# Patient Record
Sex: Female | Born: 1975
Health system: Southern US, Community
[De-identification: ages and names within clinical notes are randomized; demographics above are authoritative.]

## PROBLEM LIST (undated history)

## (undated) DIAGNOSIS — E669 Obesity, unspecified: Secondary | ICD-10-CM

## (undated) DIAGNOSIS — Z22322 Carrier or suspected carrier of Methicillin resistant Staphylococcus aureus: Secondary | ICD-10-CM

## (undated) DIAGNOSIS — N63 Unspecified lump in unspecified breast: Secondary | ICD-10-CM

## (undated) HISTORY — DX: Obesity, unspecified: E66.9

## (undated) HISTORY — PX: NO PAST SURGERIES: SHX2092

## (undated) HISTORY — DX: Unspecified lump in unspecified breast: N63.0

## (undated) HISTORY — DX: Morbid (severe) obesity due to excess calories: E66.01

---

## 1998-05-29 HISTORY — PX: DILATION AND CURETTAGE OF UTERUS: SHX78

## 2013-02-05 ENCOUNTER — Ambulatory Visit: Payer: Self-pay | Admitting: Physician Assistant

## 2015-08-02 ENCOUNTER — Ambulatory Visit
Admission: EM | Admit: 2015-08-02 | Discharge: 2015-08-02 | Disposition: A | Discharge status: Home / Self Care | Payer: BLUE CROSS/BLUE SHIELD | Attending: Family Medicine | Admitting: Family Medicine

## 2015-08-02 DIAGNOSIS — J40 Bronchitis, not specified as acute or chronic: Secondary | ICD-10-CM | POA: Diagnosis not present

## 2015-08-02 DIAGNOSIS — J0100 Acute maxillary sinusitis, unspecified: Secondary | ICD-10-CM

## 2015-08-02 MED ORDER — BENZONATATE 100 MG PO CAPS: 100.0000 mg | ORAL_CAPSULE | Freq: Three times a day (TID) | ORAL | Status: DC | PRN

## 2015-08-02 MED ORDER — GUAIFENESIN-CODEINE 100-10 MG/5ML PO SOLN: 10.0000 mL | Freq: Every evening | ORAL | Status: DC | PRN

## 2015-08-02 MED ORDER — AMOXICILLIN-POT CLAVULANATE 875-125 MG PO TABS: 1.0000 | ORAL_TABLET | Freq: Two times a day (BID) | ORAL | Status: DC

## 2015-08-02 NOTE — ED Provider Notes (Signed)
[facility]  ____________________________________________  Time seen: Approximately 1:10 PM  I have reviewed the triage vital signs and the nursing notes.   HISTORY  Chief Complaint URI  HPI Jenna Price is a 40 y.o. female presents with 7 days of runny nose, nasal congestion, nasal drainage, sinus pressure and cough. Reports thick greenish nasal drainage when blowing nose. Patient reports that she can feel postnasal drainage triggering cough. Denies sore throat. Patient reports cough is worse at night. Patient reports current sinus pressure is described as an aching pressure sensation around the cheeks of her face and forehead at 3 out of 10 aching. Denies pain radiation. Reports several sick contacts around her recently. Denies known fevers.  Denies chest pain or shortness breath, abdominal pain, dysuria, neck or back pain, rash.  LMP: one week ago, denies chance of pregnancy   History reviewed. No pertinent past medical history.  There are no active problems to display for this patient.   History reviewed. No pertinent past surgical history.  Current Outpatient Rx  Name  Route  Sig  Dispense  Refill  .           .           .             Allergies Review of patient's allergies indicates no known allergies.  History reviewed. No pertinent family history.  Social History Social History  Substance Use Topics  . Smoking status: Never Smoker   . Smokeless tobacco: Never Used  . Alcohol Use: No    Review of Systems Constitutional: No fever/chills Eyes: No visual changes. ENT: No sore throat. Positive cough, congestion.  Cardiovascular: Denies chest pain. Respiratory: Denies shortness of breath. Gastrointestinal: No abdominal pain.  No nausea, no vomiting.  No diarrhea.  No constipation. Genitourinary: Negative for dysuria. Musculoskeletal: Negative for back pain. Skin: Negative for rash. Neurological: Negative for headaches, focal weakness or  numbness.  10-point ROS otherwise negative.  ____________________________________________   PHYSICAL EXAM:  VITAL SIGNS: ED Triage Vitals  Enc Vitals Group     BP 08/02/15 1227 132/82 mmHg     Pulse Rate 08/02/15 1227 90     Resp 08/02/15 1227 18     Temp 08/02/15 1227 98.5 F (36.9 C)     Temp Source 08/02/15 1227 Oral     SpO2 08/02/15 1227 100 %     Weight 08/02/15 1227 220 lb (99.791 kg)     Height 08/02/15 1227 5\' 4"  (1.626 m)     Head Cir --      Peak Flow --      Pain Score 08/02/15 1230 0     Pain Loc --      Pain Edu? --      Excl. in GC? --   Constitutional: Alert and oriented. Well appearing and in no acute distress. Eyes: Conjunctivae are normal. PERRL. EOMI. Head: Atraumatic.Mild to moderate tenderness to palpation bilateral maxillary sinuses, mild tenderness to palpation bilateral frontal sinuses. No swelling. No erythema.   Ears: no erythema, normal TMs bilaterally.   Nose: nasal congestion with bilateral nasal turbinate erythema and edema.   Mouth/Throat: Mucous membranes are moist.  Oropharynx non-erythematous.No tonsillar swelling or exudate.  Neck: No stridor.  No cervical spine tenderness to palpation. Hematological/Lymphatic/Immunilogical: No cervical lymphadenopathy. Cardiovascular: Normal rate, regular rhythm. Grossly normal heart sounds.  Good peripheral circulation. Respiratory: Normal respiratory effort.  No retractions. Lungs CTAB. No wheezes, rales or rhonchi. Good air  movement. Dry intermittent cough noted in room.  Gastrointestinal: Soft and nontender. Obese abdomen. Normal Bowel sounds. No CVA tenderness. Musculoskeletal: No lower or upper extremity tenderness nor edema.  Bilateral pedal pulses equal and easily palpated. No cervical, thoracic or lumbar tenderness to palpation. No calf tenderness bilaterally.  Neurologic:  Normal speech and language. No gross focal neurologic deficits are appreciated. No gait instability. Skin:  Skin is warm, dry  and intact. No rash noted. Psychiatric: Mood and affect are normal. Speech and behavior are normal.   ____________________________________________   LABS (all labs ordered are listed, but only abnormal results are displayed)  Labs Reviewed - No data to display   INITIAL IMPRESSION / ASSESSMENT AND PLAN / ED COURSE  Pertinent labs & imaging results that were available during my care of the patient were reviewed by me and considered in my medical decision making (see chart for details).  Well-appearing patient. No acute distress. Presents for complaints of nasal congestion, sinus pressure and sinus drainage. Lungs clear throughout. Abdomen soft and nontender. Moist mucous membranes. Sinus tenderness to palpation with symptoms persisting for 1 week. Will treat sinusitis with oral Augmentin, when necessary Tessalon Perles and when necessary guaifenesin with codeine at night as needed for cough. Encouraged rest, fluids, over-the-counter tylenol or ibuprofen as needed. Follow up with primary care physician as needed.Work note for today and tomorrow given.   Discussed follow up with Primary care physician this week. Discussed follow up and return parameters including no resolution or any worsening concerns. Patient verbalized understanding and agreed to plan.   ____________________________________________   FINAL CLINICAL IMPRESSION(S) / ED DIAGNOSES  Final diagnoses:  Acute maxillary sinusitis, recurrence not specified  Bronchitis      Note: This dictation was prepared with Dragon dictation along with smaller phrase technology. Any transcriptional errors that result from this process are unintentional.     Renford Dills, NP 08/02/15 2057

## 2015-08-02 NOTE — Discharge Instructions (Signed)
Take medication as prescribed. Rest. Drink plenty of fluids.  ° °Follow up with your primary care physician this week as needed. Return to Urgent care for new or worsening concerns.  ° ° °Sinusitis, Adult °Sinusitis is redness, soreness, and puffiness (inflammation) of the air pockets in the bones of your face (sinuses). The redness, soreness, and puffiness can cause air and mucus to get trapped in your sinuses. This can allow germs to grow and cause an infection.  °HOME CARE  °· Drink enough fluids to keep your pee (urine) clear or pale yellow. °· Use a humidifier in your home. °· Run a hot shower to create steam in the bathroom. Sit in the bathroom with the door closed. Breathe in the steam 3-4 times a day. °· Put a warm, moist washcloth on your face 3-4 times a day, or as told by your doctor. °· Use salt water sprays (saline sprays) to wet the thick fluid in your nose. This can help the sinuses drain. °· Only take medicine as told by your doctor. °GET HELP RIGHT AWAY IF:  °· Your pain gets worse. °· You have very bad headaches. °· You are sick to your stomach (nauseous). °· You throw up (vomit). °· You are very sleepy (drowsy) all the time. °· Your face is puffy (swollen). °· Your vision changes. °· You have a stiff neck. °· You have trouble breathing. °MAKE SURE YOU:  °· Understand these instructions. °· Will watch your condition. °· Will get help right away if you are not doing well or get worse. °  °This information is not intended to replace advice given to you by your health care provider. Make sure you discuss any questions you have with your health care provider. °  °Document Released: 11/01/2007 Document Revised: 06/05/2014 Document Reviewed: 12/19/2011 °Elsevier Interactive Patient Education ©2016 [employer] Inc. ° °

## 2015-08-02 NOTE — ED Notes (Signed)
Patient c/o chest congestion and cough which started Tuesday.  Denies fever c/n/v or chest pain.

## 2015-11-29 ENCOUNTER — Ambulatory Visit (INDEPENDENT_AMBULATORY_CARE_PROVIDER_SITE_OTHER): Payer: BLUE CROSS/BLUE SHIELD | Admitting: Family Medicine

## 2015-11-29 ENCOUNTER — Encounter: Payer: Self-pay | Admitting: Family Medicine

## 2015-11-29 VITALS — BP 124/82 | HR 96 | Temp 98.6°F | Resp 16 | Ht 64.0 in | Wt 233.0 lb

## 2015-11-29 DIAGNOSIS — R635 Abnormal weight gain: Secondary | ICD-10-CM | POA: Insufficient documentation

## 2015-11-29 DIAGNOSIS — Z0000 Encounter for general adult medical examination without abnormal findings: Secondary | ICD-10-CM | POA: Diagnosis not present

## 2015-11-29 DIAGNOSIS — F329 Major depressive disorder, single episode, unspecified: Secondary | ICD-10-CM | POA: Diagnosis not present

## 2015-11-29 DIAGNOSIS — R5383 Other fatigue: Secondary | ICD-10-CM | POA: Diagnosis not present

## 2015-11-29 DIAGNOSIS — F32A Depression, unspecified: Secondary | ICD-10-CM

## 2015-11-29 LAB — TSH: TSH: 2.31 m[IU]/L

## 2015-11-29 LAB — CBC WITH DIFFERENTIAL/PLATELET
BASOS PCT: 1 %
Basophils Absolute: 54 cells/uL (ref 0–200)
EOS ABS: 54 {cells}/uL (ref 15–500)
Eosinophils Relative: 1 %
HEMATOCRIT: 40.3 % (ref 35.0–45.0)
Hemoglobin: 13.6 g/dL (ref 11.7–15.5)
LYMPHS ABS: 1890 {cells}/uL (ref 850–3900)
Lymphocytes Relative: 35 %
MCH: 28.9 pg (ref 27.0–33.0)
MCHC: 33.7 g/dL (ref 32.0–36.0)
MCV: 85.6 fL (ref 80.0–100.0)
MONO ABS: 432 {cells}/uL (ref 200–950)
MPV: 9.6 fL (ref 7.5–12.5)
Monocytes Relative: 8 %
NEUTROS ABS: 2970 {cells}/uL (ref 1500–7800)
Neutrophils Relative %: 55 %
PLATELETS: 243 10*3/uL (ref 140–400)
RBC: 4.71 MIL/uL (ref 3.80–5.10)
RDW: 14 % (ref 11.0–15.0)
WBC: 5.4 10*3/uL (ref 3.8–10.8)

## 2015-11-29 LAB — T4, FREE: FREE T4: 1.2 ng/dL (ref 0.8–1.8)

## 2015-11-29 LAB — COMPLETE METABOLIC PANEL WITH GFR
ALBUMIN: 4.1 g/dL (ref 3.6–5.1)
ALK PHOS: 58 U/L (ref 33–115)
ALT: 10 U/L (ref 6–29)
AST: 13 U/L (ref 10–30)
BILIRUBIN TOTAL: 0.6 mg/dL (ref 0.2–1.2)
BUN: 14 mg/dL (ref 7–25)
CALCIUM: 8.8 mg/dL (ref 8.6–10.2)
CO2: 22 mmol/L (ref 20–31)
CREATININE: 0.74 mg/dL (ref 0.50–1.10)
Chloride: 108 mmol/L (ref 98–110)
Glucose, Bld: 90 mg/dL (ref 65–99)
Potassium: 3.9 mmol/L (ref 3.5–5.3)
Sodium: 140 mmol/L (ref 135–146)
TOTAL PROTEIN: 6.3 g/dL (ref 6.1–8.1)

## 2015-11-29 LAB — LIPID PANEL
Cholesterol: 210 mg/dL — ABNORMAL HIGH (ref 125–200)
HDL: 50 mg/dL (ref >=46)
LDL CALC: 116 mg/dL (ref <130)
Total CHOL/HDL Ratio: 4.2 Ratio (ref <=5.0)
Triglycerides: 220 mg/dL — ABNORMAL HIGH (ref <150)
VLDL: 44 mg/dL — ABNORMAL HIGH (ref <30)

## 2015-11-29 LAB — VITAMIN B12: VITAMIN B 12: 351 pg/mL (ref 200–1100)

## 2015-11-29 MED ORDER — ESCITALOPRAM OXALATE 10 MG PO TABS: 10.0000 mg | ORAL_TABLET | Freq: Every day | ORAL | Status: DC

## 2015-11-29 NOTE — Patient Instructions (Addendum)
Please do start the new medicine Consider working with a therapist We'll get labs today If you have not heard anything from my staff in a week about any orders/referrals/studies from today, please contact us here to follow-up [phone]

## 2015-11-29 NOTE — Progress Notes (Signed)
BP 124/82   Pulse 96   Temp 98.6 F (37 C) (Oral)   Resp 16   Ht 5\' 4"  (1.626 m)   Wt 233 lb (105.7 kg)   LMP 11/22/2015   SpO2 95%   BMI 39.99 kg/m    Subjective:    Patient ID: Jenna Price, female    DOB: [birth year], 40 y.o.   MRN: [id number]  HPI: Jenna Price is a [age] y.o. female  Chief Complaint  Patient presents with  . Fatigue  . Anxiety   She is new to me, usual provider is out of the office  She started a new job in late 2015; gained 70 pounds She is tired Short-fused Stressed Night-time awakenings None of this is like her No constipation; no hair changes; no changes in skin No fam hx of thyroid disease; sister has been diagnosed with depression; nothing to do with father, mother died when pt was 40 years old Headaches have increased She is really tired, very fatigued She hopes her husband falls asleep Gritting teeth; tightness in shoulders an neck She has never been on medicine for mood or stress She is the oldest of four Tossing and turning at night Calls into work sick sometimes Sometimes gets panic attacks; might feel it coming on at work and tries to calm down  [facility] 11/29/2015 11/29/2015  Decreased Interest - 0  Down, Depressed, Hopeless 2 0  PHQ - 2 Score 2 0  Altered sleeping 3 -  Tired, decreased energy 3 -  Change in appetite 3 -  Feeling bad or failure about yourself  3 -  Trouble concentrating 2 -  Moving slowly or fidgety/restless 0 -  Suicidal thoughts 0 -  PHQ-9 Score 16 -  Difficult doing work/chores Extremely dIfficult -   Relevant past medical, surgical, family and social history reviewed Past Medical History:  Diagnosis Date  . Obesity    Past Surgical History:  Procedure Laterality Date  . NO PAST SURGERIES     History reviewed. No pertinent family history. Social History  Substance Use Topics  . Smoking status: Never Smoker  . Smokeless tobacco: Never Used  . Alcohol use No   Interim medical  history since last visit reviewed. Allergies and medications reviewed  Review of Systems Per HPI unless specifically indicated above     Objective:    BP 124/82   Pulse 96   Temp 98.6 F (37 C) (Oral)   Resp 16   Ht 5\' 4"  (1.626 m)   Wt 233 lb (105.7 kg)   LMP 11/22/2015   SpO2 95%   BMI 39.99 kg/m   Wt Readings from Last 3 Encounters:  11/29/15 233 lb (105.7 kg)  08/02/15 220 lb (99.8 kg)    Physical Exam  Constitutional: She appears well-developed and well-nourished. No distress.  Obese, weight gain 13 pounds over last 4 months  HENT:  Head: Normocephalic and atraumatic.  Eyes: EOM are normal. No scleral icterus.  Neck: No thyromegaly present.  Cardiovascular: Normal rate, regular rhythm and normal heart sounds.   No murmur heard. Pulmonary/Chest: Effort normal and breath sounds normal. No respiratory distress. She has no wheezes.  Abdominal: Soft. Bowel sounds are normal. She exhibits no distension.  Musculoskeletal: Normal range of motion. She exhibits no edema.  Neurological: She is alert.  Skin: Skin is warm and dry. She is not diaphoretic. No pallor.  Psychiatric: She has a normal mood and affect. Her behavior is  normal. Judgment and thought content normal.       Assessment & Plan:   Problem List Items Addressed This Visit      Other   Depression    Discussed score on PHQ-9; encouraged her to start to work with counselor; offered SSRI; start treatment, but call before next appt if any mania or hypomania or dark thoughts; she agrees      Relevant Medications   escitalopram (LEXAPRO) 10 MG tablet   Abnormal weight gain - Primary    Other Visit Diagnoses    Other fatigue       check labs to r/o hypothyroidism, vit D or B12 deficiency with her associated weight gain and mood issues   Relevant Orders   VITAMIN D 25 Hydroxy (Vit-D Deficiency, Fractures) (Completed)   Vitamin B12 (Completed)   T4, free (Completed)   Preventative health care       check  yearly labs; return for physical soon   Relevant Orders   CBC with Differential/Platelet (Completed)   COMPLETE METABOLIC PANEL WITH GFR (Completed)   TSH (Completed)   Lipid panel (Completed)      Follow up plan: Return in about 3 weeks (around 12/20/2015) for medicine follow-up.  An after-visit summary was printed and given to the patient at check-out.  Please see the patient instructions which may contain other information and recommendations beyond what is mentioned above in the assessment and plan.  Meds ordered this encounter  Medications  . escitalopram (LEXAPRO) 10 MG tablet    Sig: Take 1 tablet (10 mg total) by mouth daily.    Dispense:  30 tablet    Refill:  0    Orders Placed This Encounter  Procedures  . CBC with Differential/Platelet  . COMPLETE METABOLIC PANEL WITH GFR  . TSH  . VITAMIN D 25 Hydroxy (Vit-D Deficiency, Fractures)  . Vitamin B12  . Lipid panel  . T4, free

## 2015-11-30 LAB — VITAMIN D 25 HYDROXY (VIT D DEFICIENCY, FRACTURES): VIT D 25 HYDROXY: 23 ng/mL — AB (ref 30–100)

## 2015-12-10 DIAGNOSIS — H524 Presbyopia: Secondary | ICD-10-CM | POA: Diagnosis not present

## 2015-12-19 DIAGNOSIS — F32A Depression, unspecified: Secondary | ICD-10-CM | POA: Insufficient documentation

## 2015-12-19 DIAGNOSIS — F329 Major depressive disorder, single episode, unspecified: Secondary | ICD-10-CM | POA: Insufficient documentation

## 2015-12-19 NOTE — Assessment & Plan Note (Signed)
Discussed score on PHQ-9; encouraged her to start to work with counselor; offered SSRI; start treatment, but call before next appt if any mania or hypomania or dark thoughts; she agrees

## 2015-12-21 ENCOUNTER — Ambulatory Visit: Payer: BLUE CROSS/BLUE SHIELD | Admitting: Family Medicine

## 2016-01-23 ENCOUNTER — Ambulatory Visit
Admission: EM | Admit: 2016-01-23 | Discharge: 2016-01-23 | Disposition: A | Discharge status: Home / Self Care | Payer: BLUE CROSS/BLUE SHIELD | Attending: Family Medicine | Admitting: Family Medicine

## 2016-01-23 ENCOUNTER — Encounter: Payer: Self-pay | Admitting: Gynecology

## 2016-01-23 DIAGNOSIS — S90821A Blister (nonthermal), right foot, initial encounter: Secondary | ICD-10-CM | POA: Diagnosis not present

## 2016-01-23 DIAGNOSIS — L089 Local infection of the skin and subcutaneous tissue, unspecified: Secondary | ICD-10-CM

## 2016-01-23 HISTORY — DX: Carrier or suspected carrier of methicillin resistant Staphylococcus aureus: Z22.322

## 2016-01-23 MED ORDER — SULFAMETHOXAZOLE-TRIMETHOPRIM 800-160 MG PO TABS: 1.0000 | ORAL_TABLET | Freq: Two times a day (BID) | ORAL | 0 refills | Status: AC

## 2016-01-23 NOTE — ED Triage Notes (Addendum)
Per patient bought new shoe and then had blistered on right ankle. Patient state s/p MRSA

## 2016-01-23 NOTE — Discharge Instructions (Signed)
May continue to apply Bacitracin ointment to area and cover with bandaid. Start Bactrim twice a day as directed. Follow-up with your primary care provider if increased redness, pain, or swelling occurs.

## 2016-01-23 NOTE — ED Provider Notes (Signed)
CSN: [id number]     Arrival date & time 01/23/16  0803 History   First MD Initiated Contact with Patient 01/23/16 0840     Chief Complaint  Patient presents with  . Blister   (Consider location/radiation/quality/duration/timing/severity/associated sxs/prior Treatment) 40 year old female presents with open sore on right outer area of ankle that started 4 days ago. She wore new shoes that irritated her foot. The area has became more sore and swollen in the past 2 days. Concerned over infection since had MRSA about 4 years ago on her thigh. She has applied Bacitracin ointment and covered with a band-aid which has helped some but redness and swelling is spreading today.    The history is provided by the patient.    Past Medical History:  Diagnosis Date  . MRSA (methicillin resistant staph aureus) culture positive   . Obesity    Past Surgical History:  Procedure Laterality Date  . NO PAST SURGERIES     No family history on file. Social History  Substance Use Topics  . Smoking status: Never Smoker  . Smokeless tobacco: Never Used  . Alcohol use No   OB History    No data available     Review of Systems  Constitutional: Negative for chills, fatigue and fever.  Musculoskeletal: Positive for joint swelling.  Skin: Positive for color change and wound.  Neurological: Negative for weakness and numbness.  Hematological: Negative.     Allergies  Review of patient's allergies indicates no known allergies.  Home Medications   Prior to Admission medications   Medication Sig Start Date End Date Taking? Authorizing Provider  escitalopram (LEXAPRO) 10 MG tablet Take 1 tablet (10 mg total) by mouth daily. 11/29/15   Kerman PasseyMelinda P Lada, MD  sulfamethoxazole-trimethoprim (BACTRIM DS,SEPTRA DS) 800-160 MG tablet Take 1 tablet by mouth 2 (two) times daily. 01/23/16 02/02/16  Sudie GrumblingAnn Berry Eron Goble, NP   Meds Ordered and Administered this Visit  Medications - No data to display  BP 133/75 (BP Location:  Left Arm)   Pulse 88   Temp 98.2 F (36.8 C) (Oral)   Resp 16   Ht 5\' 4"  (1.626 m)   Wt 228 lb (103.4 kg)   LMP 01/04/2016 (Exact Date)   SpO2 99%   BMI 39.14 kg/m  No data found.   Physical Exam  Constitutional: She is oriented to person, place, and time. She appears well-developed and well-nourished. No distress.  Cardiovascular: Normal rate and regular rhythm.   Musculoskeletal: Normal range of motion. She exhibits tenderness.       Right foot: There is tenderness and swelling. There is normal range of motion and normal capillary refill.       Feet:  Red, open blister about 2cm present on right lateral aspect of ankle just below malleolus. Clear to yellowish discharge present. Very tender. Slight swelling around area with minimal surrounding erythema. No nuero deficits. Good pulses and capillary refill.   Neurological: She is alert and oriented to person, place, and time. She has normal strength. No sensory deficit.  Skin: Skin is warm and dry. Capillary refill takes less than 2 seconds.  Psychiatric: She has a normal mood and affect. Her behavior is normal. Judgment and thought content normal.    Urgent Care Course   Clinical Course    Procedures (including critical care time)  Labs Review Labs Reviewed - No data to display  Imaging Review No results found.   Visual Acuity Review  Right Eye Distance:  Left Eye Distance:   Bilateral Distance:    Right Eye Near:   Left Eye Near:    Bilateral Near:         MDM   1. Infected blister of right foot, initial encounter    Due to previous history of MRSA, recommend Bactrim DS twice a day for 10 days. Continue to apply Bacitracin ointment to area and cover with bandage. Avoid pressure on area- wear comfortable shoes. Keep area open at bedtime. Follow-up with her PCP if wound is not improving or is getting worse within the next 3 days.     Sudie Grumbling, NP 01/24/16 [phone]

## 2016-04-04 ENCOUNTER — Other Ambulatory Visit: Payer: Self-pay | Admitting: Family Medicine

## 2016-04-04 DIAGNOSIS — Z1231 Encounter for screening mammogram for malignant neoplasm of breast: Secondary | ICD-10-CM

## 2016-04-11 ENCOUNTER — Ambulatory Visit: Admission: RE | Admit: 2016-04-11 | Payer: BLUE CROSS/BLUE SHIELD | Source: Ambulatory Visit

## 2016-04-13 ENCOUNTER — Other Ambulatory Visit: Payer: Self-pay | Admitting: Family Medicine

## 2016-04-13 ENCOUNTER — Ambulatory Visit
Admission: RE | Admit: 2016-04-13 | Discharge: 2016-04-13 | Disposition: A | Discharge status: Home / Self Care | Payer: BLUE CROSS/BLUE SHIELD | Source: Ambulatory Visit | Attending: Family Medicine | Admitting: Family Medicine

## 2016-04-13 DIAGNOSIS — Z1231 Encounter for screening mammogram for malignant neoplasm of breast: Secondary | ICD-10-CM | POA: Diagnosis not present

## 2016-04-13 DIAGNOSIS — R928 Other abnormal and inconclusive findings on diagnostic imaging of breast: Secondary | ICD-10-CM | POA: Diagnosis not present

## 2016-04-14 ENCOUNTER — Other Ambulatory Visit: Payer: Self-pay | Admitting: Family Medicine

## 2016-04-14 ENCOUNTER — Telehealth: Payer: Self-pay | Admitting: Family Medicine

## 2016-04-14 DIAGNOSIS — R928 Other abnormal and inconclusive findings on diagnostic imaging of breast: Secondary | ICD-10-CM | POA: Diagnosis not present

## 2016-04-14 DIAGNOSIS — N6320 Unspecified lump in the left breast, unspecified quadrant: Secondary | ICD-10-CM

## 2016-04-14 NOTE — Telephone Encounter (Signed)
Called pt regarding abnormal  mammogram of the left breast. Dr. lada put in another order of the left breast. Mention to pt that is was abnormal  and she decided to order more test.

## 2016-04-14 NOTE — Telephone Encounter (Signed)
I see patient had an abnormal screening mammogram; please let her know that I put in orders for additional pictures, and we hope everything turns out just fine

## 2016-04-14 NOTE — Assessment & Plan Note (Signed)
Nov 2017; additional imaging ordered

## 2016-04-25 ENCOUNTER — Ambulatory Visit
Admission: RE | Admit: 2016-04-25 | Discharge: 2016-04-25 | Disposition: A | Discharge status: Home / Self Care | Payer: BLUE CROSS/BLUE SHIELD | Source: Ambulatory Visit | Attending: Family Medicine | Admitting: Family Medicine

## 2016-04-25 DIAGNOSIS — N6320 Unspecified lump in the left breast, unspecified quadrant: Secondary | ICD-10-CM | POA: Diagnosis not present

## 2016-07-18 ENCOUNTER — Encounter: Payer: BLUE CROSS/BLUE SHIELD | Admitting: Family Medicine

## 2016-08-03 ENCOUNTER — Encounter: Payer: Self-pay | Admitting: Family Medicine

## 2016-08-03 ENCOUNTER — Ambulatory Visit (INDEPENDENT_AMBULATORY_CARE_PROVIDER_SITE_OTHER): Payer: BLUE CROSS/BLUE SHIELD | Admitting: Family Medicine

## 2016-08-03 HISTORY — DX: Morbid (severe) obesity due to excess calories: E66.01

## 2016-08-03 MED ORDER — NALTREXONE-BUPROPION HCL ER 8-90 MG PO TB12: ORAL_TABLET | ORAL | 0 refills | Status: DC

## 2016-08-03 NOTE — Patient Instructions (Addendum)
For toning, you'll want to do lower weights and more reps than your son  Check out the information at familydoctor.org entitled "Nutrition for Weight Loss: What You Need to Know about Fad Diets" Try to lose between 1-2 pounds per week by taking in fewer calories and burning off more calories You can succeed by limiting portions, limiting foods dense in calories and fat, becoming more active, and drinking 8 glasses of water a day (64 ounces) Don't skip meals, especially breakfast, as skipping meals may alter your metabolism Do not use over-the-counter weight loss pills or gimmicks that claim rapid weight loss A healthy BMI (or body mass index) is between 18.5 and 24.9 You can calculate your ideal BMI at the NIH website [employer].huhttp://www.nhlbi.nih.gov/health/educational/lose_wt/BMI/bmicalc.htm  Bernie CoveySaxenda is the injectable and is my first choice Contrave would be a close second Belviq would be third choice Check with your insurance We have savings cards for all three  Try to limit saturated fats in your diet (bologna, hot dogs, barbeque, cheeseburgers, hamburgers, steak, bacon, sausage, cheese, etc.) and get more fresh fruits, vegetables, and whole grains   Obesity, Adult Obesity is having too much body fat. If you have a BMI of 30 or more, you are obese. BMI is a number that explains how much body fat you have. Obesity is often caused by taking in (consuming) more calories than your body uses. Obesity can cause serious health problems. Changing your lifestyle can help to treat obesity. Follow these instructions at home: Eating and drinking    Follow advice from your doctor about what to eat and drink. Your doctor may tell you to:  Cut down on (limit) fast foods, sweets, and processed snack foods.  Choose low-fat options. For example, choose low-fat milk instead of whole milk.  Eat 5 or more servings of fruits or vegetables every day.  Eat at home more often. This gives you more control over  what you eat.  Choose healthy foods when you eat out.  Learn what a healthy portion size is. A portion size is the amount of a certain food that is healthy for you to eat at one time. This is different for each person.  Keep low-fat snacks available.  Avoid sugary drinks. These include soda, fruit juice, iced tea that is sweetened with sugar, and flavored milk.  Eat a healthy breakfast.  Drink enough water to keep your pee (urine) clear or pale yellow.  Do not go without eating for long periods of time (do not fast).  Do not go on popular or trendy diets (fad diets). Physical Activity   Exercise often, as told by your doctor. Ask your doctor:  What types of exercise are safe for you.  How often you should exercise.  Warm up and stretch before being active.  Do slow stretching after being active (cool down).  Rest between times of being active. Lifestyle   Limit how much time you spend in front of your TV, computer, or video game system (be less sedentary).  Find ways to reward yourself that do not involve food.  Limit alcohol intake to no more than 1 drink a day for nonpregnant women and 2 drinks a day for men. One drink equals 12 oz of beer, 5 oz of wine, or 1 oz of hard liquor. General instructions   Keep a weight loss journal. This can help you keep track of:  The food that you eat.  The exercise that you do.  Take over-the-counter and prescription medicines  only as told by your doctor.  Take vitamins and supplements only as told by your doctor.  Think about joining a support group. Your doctor may be able to help with this.  Keep all follow-up visits as told by your doctor. This is important. Contact a doctor if:  You cannot meet your weight loss goal after you have changed your diet and lifestyle for 6 weeks. This information is not intended to replace advice given to you by your health care provider. Make sure you discuss any questions you have with your  health care provider. Document Released: 08/07/2011 Document Revised: 10/21/2015 Document Reviewed: 03/03/2015 Elsevier Interactive Patient Education  2017 [employer].

## 2016-08-03 NOTE — Assessment & Plan Note (Signed)
Talked with patient about obesity as a disease; so glad she has started her weight loss efforts in a healthy fashion, exercising, eating better; will have her increase water; limit or avoid cow dairy; discussed medications available for weight loss assistance, including Saxenda, Contrave, and Belviq; savings cards given for all three and she'll read about them and contact her insurance company to see which is covered and which she would prefer; discussed risks of them; she will let me know and I'll prescribe; I will not prescribe plain phentermine; see AVS; for toning, do weights but less weight and more reps

## 2016-08-03 NOTE — Progress Notes (Signed)
BP 124/72   Pulse 90   Temp 98.1 F (36.7 C) (Oral)   Resp 14   Wt 236 lb 9.6 oz (107.3 kg)   LMP 08/01/2016   SpO2 96%   BMI 40.61 kg/m    Subjective:    Patient ID: Jenna Price, female    DOB: 27-Sep-1975, 41 y.o.   MRN: [id number]  HPI: Jenna Price is a 41 y.o. female  Chief Complaint  Patient presents with  . Obesity    Wants to talk about weight loss med   She had been working a job where she was traveling a lot Now has a new job, only has to go to [place] on Mondays, and works from home the other days of the week Has to focus on herself now Her son is playing baseball and trying to get bigger; she joined the gym with him and has been working out with him; she does 45 minutes of walking on the treadmill at 3 mph; then she does the weight, arm day and leg day and back day She has been reading a lot Eating broccoli for lunch; probiotic yogurt in the morning; last night, grilled zucchini and petite sirloin She thinks she is on the right track She already feels  Much better She put on so much weight when traveling so much She does not want to buy bigger clothes She took phentermine once before and lost a lot of weight Her peak weight was 247 pounds about a month ago, not back on the scale She stopped cold [place] drinking anything but water; used to drink diet Coke and tea She was reading about Contrave, and is aware of Belviq She had her mammogram done recently No hx MEN-2, no thyroid medullary carcinoma, no hx of pancreatitis Thyroid numbers okay No hx of heart murmur  Depression screen [facility] 2/9 08/03/2016 11/29/2015 11/29/2015  Decreased Interest 0 - 0  Down, Depressed, Hopeless 0 2 0  PHQ - 2 Score 0 2 0  Altered sleeping - 3 -  Tired, decreased energy - 3 -  Change in appetite - 3 -  Feeling bad or failure about yourself  - 3 -  Trouble concentrating - 2 -  Moving slowly or fidgety/restless - 0 -  Suicidal thoughts - 0 -  PHQ-9 Score - 16 -  Difficult doing  work/chores - Extremely dIfficult -    No flowsheet data found.  Relevant past medical, surgical, family and social history reviewed Past Medical History:  Diagnosis Date  . Morbid obesity ([place]) 08/03/2016  . MRSA (methicillin resistant staph aureus) culture positive   . Obesity    Past Surgical History:  Procedure Laterality Date  . NO PAST SURGERIES     Family History  Problem Relation Age of Onset  . Breast cancer Maternal Aunt     mat great aunt  . Drug abuse Mother   . Heart disease Mother    Social History  Substance Use Topics  . Smoking status: Never Smoker  . Smokeless tobacco: Never Used  . Alcohol use No    Interim medical history since last visit reviewed. Allergies and medications reviewed  Review of Systems Per HPI unless specifically indicated above     Objective:    BP 124/72   Pulse 90   Temp 98.1 F (36.7 C) (Oral)   Resp 14   Wt 236 lb 9.6 oz (107.3 kg)   LMP 08/01/2016   SpO2 96%   BMI  40.61 kg/m   Wt Readings from Last 3 Encounters:  08/03/16 236 lb 9.6 oz (107.3 kg)  01/23/16 228 lb (103.4 kg)  11/29/15 233 lb (105.7 kg)    Physical Exam  Constitutional: She appears well-developed and well-nourished. No distress.  Eyes: No scleral icterus.  Neck: No thyromegaly present.  Cardiovascular: Normal rate and regular rhythm.   No murmur heard. Pulmonary/Chest: Effort normal and breath sounds normal.  Abdominal: She exhibits no distension.  Neurological: She is alert.  Skin: No pallor.  Psychiatric: She has a normal mood and affect. Her behavior is normal. Judgment and thought content normal.    Results for orders placed or performed in visit on 11/29/15  CBC with Differential/Platelet  Result Value Ref Range   WBC 5.4 3.8 - 10.8 K/uL   RBC 4.71 3.80 - 5.10 MIL/uL   Hemoglobin 13.6 11.7 - 15.5 g/dL   HCT 40.3 35.0 - 45.0 %   MCV 85.6 80.0 - 100.0 fL   MCH 28.9 27.0 - 33.0 pg   MCHC 33.7 32.0 - 36.0 g/dL   RDW 14.0 11.0 - 15.0 %     Platelets 243 140 - 400 K/uL   MPV 9.6 7.5 - 12.5 fL   Neutro Abs 2,970 1,500 - 7,800 cells/uL   Lymphs Abs 1,890 850 - 3,900 cells/uL   Monocytes Absolute 432 200 - 950 cells/uL   Eosinophils Absolute 54 15 - 500 cells/uL   Basophils Absolute 54 0 - 200 cells/uL   Neutrophils Relative % 55 %   Lymphocytes Relative 35 %   Monocytes Relative 8 %   Eosinophils Relative 1 %   Basophils Relative 1 %   Smear Review Criteria for review not met   COMPLETE METABOLIC PANEL WITH GFR  Result Value Ref Range   Sodium 140 135 - 146 mmol/L   Potassium 3.9 3.5 - 5.3 mmol/L   Chloride 108 98 - 110 mmol/L   CO2 22 20 - 31 mmol/L   Glucose, Bld 90 65 - 99 mg/dL   BUN 14 7 - 25 mg/dL   Creat 0.74 0.50 - 1.10 mg/dL   Total Bilirubin 0.6 0.2 - 1.2 mg/dL   Alkaline Phosphatase 58 33 - 115 U/L   AST 13 10 - 30 U/L   ALT 10 6 - 29 U/L   Total Protein 6.3 6.1 - 8.1 g/dL   Albumin 4.1 3.6 - 5.1 g/dL   Calcium 8.8 8.6 - 10.2 mg/dL   GFR, Est African American >89 >=60 mL/min   GFR, Est Non African American >89 >=60 mL/min  TSH  Result Value Ref Range   TSH 2.31 mIU/L  VITAMIN D 25 Hydroxy (Vit-D Deficiency, Fractures)  Result Value Ref Range   Vit D, 25-Hydroxy 23 (L) 30 - 100 ng/mL  Vitamin B12  Result Value Ref Range   Vitamin B-12 351 200 - 1,100 pg/mL  Lipid panel  Result Value Ref Range   Cholesterol 210 (H) 125 - 200 mg/dL   Triglycerides 220 (H) <150 mg/dL   HDL 50 >=46 mg/dL   Total CHOL/HDL Ratio 4.2 <=5.0 Ratio   VLDL 44 (H) <30 mg/dL   LDL Cholesterol 116 <130 mg/dL  T4, free  Result Value Ref Range   Free T4 1.2 0.8 - 1.8 ng/dL      Assessment & Plan:   Problem List Items Addressed This Visit      Other   Morbid obesity ([place])    Talked with patient about obesity as  a disease; so glad she has started her weight loss efforts in a healthy fashion, exercising, eating better; will have her increase water; limit or avoid cow dairy; discussed medications available for weight  loss assistance, including Saxenda, Contrave, and Belviq; savings cards given for all three and she'll read about them and contact her insurance company to see which is covered and which she would prefer; discussed risks of them; she will let me know and I'll prescribe; I will not prescribe plain phentermine; see AVS; for toning, do weights but less weight and more reps          Follow up plan: Return in about 3 weeks (around 08/24/2016) for complete physical.  An after-visit summary was printed and given to the patient at [facility].  Please see the patient instructions which may contain other information and recommendations beyond what is mentioned above in the assessment and plan.

## 2016-08-22 ENCOUNTER — Other Ambulatory Visit: Payer: Self-pay | Admitting: Family Medicine

## 2016-08-22 ENCOUNTER — Ambulatory Visit (INDEPENDENT_AMBULATORY_CARE_PROVIDER_SITE_OTHER): Payer: BLUE CROSS/BLUE SHIELD | Admitting: Family Medicine

## 2016-08-22 ENCOUNTER — Encounter: Payer: Self-pay | Admitting: Family Medicine

## 2016-08-22 DIAGNOSIS — Z0000 Encounter for general adult medical examination without abnormal findings: Secondary | ICD-10-CM | POA: Diagnosis not present

## 2016-08-22 DIAGNOSIS — E669 Obesity, unspecified: Secondary | ICD-10-CM | POA: Diagnosis not present

## 2016-08-22 DIAGNOSIS — Z124 Encounter for screening for malignant neoplasm of cervix: Secondary | ICD-10-CM

## 2016-08-22 NOTE — Assessment & Plan Note (Signed)
BMI now under 40, praise given; continue hard work, hydration, healthy eating

## 2016-08-22 NOTE — Addendum Note (Signed)
Addended by: Davene CostainGRAVES, Ginnette Gates C on: 08/22/2016 09:22 AM   Modules accepted: Orders

## 2016-08-22 NOTE — Progress Notes (Signed)
Patient ID: Jenna Price, female   DOB: 1975-08-20, 41 y.o.   MRN: [id number]   Subjective:   Jenna Price is a 41 y.o. female here for a complete physical exam  Interim issues since last visit: none  USPSTF grade A and B recommendations Diet: eating better; getting calcium, unsweetened almond milk, kale; fruits and veggies Exercise: started contrave, had been going 4 days a week Skin cancer: n worrisome moles Depression:  Depression screen [facility] 2/9 08/22/2016 08/03/2016 11/29/2015 11/29/2015  Decreased Interest 0 0 - 0  Down, Depressed, Hopeless 0 0 2 0  PHQ - 2 Score 0 0 2 0  Altered sleeping - - 3 -  Tired, decreased energy - - 3 -  Change in appetite - - 3 -  Feeling bad or failure about yourself  - - 3 -  Trouble concentrating - - 2 -  Moving slowly or fidgety/restless - - 0 -  Suicidal thoughts - - 0 -  PHQ-9 Score - - 16 -  Difficult doing work/chores - - Extremely dIfficult -   Hypertension: controlled Obesity: losing weight; using Contrave, lemon water and toast in am; [employer] app Tobacco use: no Alcohol: no  HIV, hep B, hep C: declined STD testing and prevention (chl/gon/syphilis): declined Lipids:  Lab Results  Component Value Date   CHOL 210 (H) 11/29/2015   HDL 50 11/29/2015   LDLCALC 116 11/29/2015   TRIG 220 (H) 11/29/2015   CHOLHDL 4.2 11/29/2015   Glucose: 90 in July 2017 Colorectal cancer: no fam hx of colon cancer; start at age 72 Breast cancer: UTD Apr 25, 2016 BRCA gene screening: no breast or ovarian cancer Intimate partner violence: no abuse Cervical cancer screening: last pap smear maybe 3+ years, Dr. Rutherford Nail did last; no abnormals Lung cancer: n/a Osteoporosis: n/a Fall prevention/vitamin D: getting enough AAA: n/a Aspirin: n/a  Past Medical History:  Diagnosis Date  . Morbid obesity ([place]) 08/03/2016  . MRSA (methicillin resistant staph aureus) culture positive   . Obesity    Past Surgical History:  Procedure Laterality Date  . NO PAST  SURGERIES     Family History  Problem Relation Age of Onset  . Breast cancer Maternal Aunt     mat great aunt  . Drug abuse Mother   . Heart disease Mother    Social History  Substance Use Topics  . Smoking status: Never Smoker  . Smokeless tobacco: Never Used  . Alcohol use No   Review of Systems  Constitutional: Negative for unexpected weight change (patient working hard).  Eyes: Negative for visual disturbance (in readers).  Cardiovascular: Negative for chest pain.  Gastrointestinal: Negative for blood in stool.  Genitourinary: Negative for hematuria and vaginal pain.  Hematological: Does not bruise/bleed easily.    Objective:   Vitals:   08/22/16 0832  BP: 124/78  Pulse: 80  Resp: 16  Temp: 98.2 F (36.8 C)  TempSrc: Oral  SpO2: 98%  Weight: 232 lb 3.2 oz (105.3 kg)  Height: 5' 4" (1.626 m)   Body mass index is 39.86 kg/m. Wt Readings from Last 3 Encounters:  08/22/16 232 lb 3.2 oz (105.3 kg)  08/03/16 236 lb 9.6 oz (107.3 kg)  01/23/16 228 lb (103.4 kg)   Physical Exam  Constitutional: She appears well-developed and well-nourished.  Weight loss noted  HENT:  Head: Normocephalic and atraumatic.  Eyes: Conjunctivae and EOM are normal. Right eye exhibits no hordeolum. Left eye exhibits no hordeolum. No scleral icterus.  Neck: Carotid bruit is not present. No thyromegaly present.  Cardiovascular: Normal rate, regular rhythm, S1 normal, S2 normal and normal heart sounds.   No extrasystoles are present.  Pulmonary/Chest: Effort normal and breath sounds normal. No respiratory distress. Right breast exhibits no inverted nipple, no mass, no nipple discharge, no skin change and no tenderness. Left breast exhibits no inverted nipple, no mass, no nipple discharge, no skin change and no tenderness. Breasts are symmetrical.  Abdominal: Soft. Normal appearance and bowel sounds are normal. She exhibits no distension, no abdominal bruit, no pulsatile midline mass and no  mass. There is no hepatosplenomegaly. There is no tenderness. No hernia.  Genitourinary: Uterus normal. Pelvic exam was performed with patient prone. There is no rash or lesion on the right labia. There is no rash or lesion on the left labia. Cervix exhibits no motion tenderness. Right adnexum displays no mass, no tenderness and no fullness. Left adnexum displays no mass, no tenderness and no fullness.  Musculoskeletal: Normal range of motion. She exhibits no edema.  Lymphadenopathy:       Head (right side): No submandibular adenopathy present.       Head (left side): No submandibular adenopathy present.    She has no cervical adenopathy.    She has no axillary adenopathy.  Neurological: She is alert. She displays no tremor. No cranial nerve deficit. She exhibits normal muscle tone. Gait normal.  Skin: Skin is warm and dry. No bruising and no ecchymosis noted. No cyanosis. No pallor.  Psychiatric: She has a normal mood and affect. Her speech is normal and behavior is normal. Thought content normal. Her mood appears not anxious. She does not exhibit a depressed mood.    Assessment/Plan:   Problem List Items Addressed This Visit      Other   Preventative health care    USPSTF grade A and B recommendations reviewed with patient; age-appropriate recommendations, preventive care, screening tests, etc discussed and encouraged; healthy living encouraged; see AVS for patient education given to patient      Obesity (BMI 35.0-39.9 without comorbidity)    BMI now under 40, praise given; continue hard work, hydration, healthy eating      Cervical cancer screening    Pap smear collected today      Relevant Orders   Pap IG and HPV (high risk) DNA detection       No orders of the defined types were placed in this encounter.  No orders of the defined types were placed in this encounter.   Follow up plan: Return in about 1 year (around 08/22/2017) for complete physical.  An After Visit  Summary was printed and given to the patient.

## 2016-08-22 NOTE — Assessment & Plan Note (Signed)
USPSTF grade A and B recommendations reviewed with patient; age-appropriate recommendations, preventive care, screening tests, etc discussed and encouraged; healthy living encouraged; see AVS for patient education given to patient  

## 2016-08-22 NOTE — Assessment & Plan Note (Signed)
Pap smear collected today 

## 2016-08-22 NOTE — Patient Instructions (Addendum)
Health Maintenance, Female Adopting a healthy lifestyle and getting preventive care can go a long way to promote health and wellness. Talk with your health care provider about what schedule of regular examinations is right for you. This is a good chance for you to check in with your provider about disease prevention and staying healthy. In between checkups, there are plenty of things you can do on your own. Experts have done a lot of research about which lifestyle changes and preventive measures are most likely to keep you healthy. Ask your health care provider for more information. Weight and diet Eat a healthy diet  Be sure to include plenty of vegetables, fruits, low-fat dairy products, and lean protein.  Do not eat a lot of foods high in solid fats, added sugars, or salt.  Get regular exercise. This is one of the most important things you can do for your health.  Most adults should exercise for at least 150 minutes each week. The exercise should increase your heart rate and make you sweat (moderate-intensity exercise).  Most adults should also do strengthening exercises at least twice a week. This is in addition to the moderate-intensity exercise. Maintain a healthy weight  Body mass index (BMI) is a measurement that can be used to identify possible weight problems. It estimates body fat based on height and weight. Your health care provider can help determine your BMI and help you achieve or maintain a healthy weight.  For females 76 years of age and older:  A BMI below 18.5 is considered underweight.  A BMI of 18.5 to 24.9 is normal.  A BMI of 25 to 29.9 is considered overweight.  A BMI of 30 and above is considered obese. Watch levels of cholesterol and blood lipids  You should start having your blood tested for lipids and cholesterol at 42 years of age, then have this test every 5 years.  You may need to have your cholesterol levels checked more often if:  Your lipid or  cholesterol levels are high.  You are older than 42 years of age.  You are at high risk for heart disease. Cancer screening Lung Cancer  Lung cancer screening is recommended for adults 64-42 years old who are at high risk for lung cancer because of a history of smoking.  A yearly low-dose CT scan of the lungs is recommended for people who:  Currently smoke.  Have quit within the past 15 years.  Have at least a 30-pack-year history of smoking. A pack year is smoking an average of one pack of cigarettes a day for 1 year.  Yearly screening should continue until it has been 15 years since you quit.  Yearly screening should stop if you develop a health problem that would prevent you from having lung cancer treatment. Breast Cancer  Practice breast self-awareness. This means understanding how your breasts normally appear and feel.  It also means doing regular breast self-exams. Let your health care provider know about any changes, no matter how small.  If you are in your 20s or 30s, you should have a clinical breast exam (CBE) by a health care provider every 1-3 years as part of a regular health exam.  If you are 34 or older, have a CBE every year. Also consider having a breast X-ray (mammogram) every year.  If you have a family history of breast cancer, talk to your health care provider about genetic screening.  If you are at high risk for breast cancer, talk  to your health care provider about having an MRI and a mammogram every year.  Breast cancer gene (BRCA) assessment is recommended for women who have family members with BRCA-related cancers. BRCA-related cancers include:  Breast.  Ovarian.  Tubal.  Peritoneal cancers.  Results of the assessment will determine the need for genetic counseling and BRCA1 and BRCA2 testing. Cervical Cancer  Your health care provider may recommend that you be screened regularly for cancer of the pelvic organs (ovaries, uterus, and vagina).  This screening involves a pelvic examination, including checking for microscopic changes to the surface of your cervix (Pap test). You may be encouraged to have this screening done every 3 years, beginning at age 24.  For women ages 66-65, health care providers may recommend pelvic exams and Pap testing every 3 years, or they may recommend the Pap and pelvic exam, combined with testing for human papilloma virus (HPV), every 5 years. Some types of HPV increase your risk of cervical cancer. Testing for HPV may also be done on women of any age with unclear Pap test results.  Other health care providers may not recommend any screening for nonpregnant women who are considered low risk for pelvic cancer and who do not have symptoms. Ask your health care provider if a screening pelvic exam is right for you.  If you have had past treatment for cervical cancer or a condition that could lead to cancer, you need Pap tests and screening for cancer for at least 20 years after your treatment. If Pap tests have been discontinued, your risk factors (such as having a new sexual partner) need to be reassessed to determine if screening should resume. Some women have medical problems that increase the chance of getting cervical cancer. In these cases, your health care provider may recommend more frequent screening and Pap tests. Colorectal Cancer  This type of cancer can be detected and often prevented.  Routine colorectal cancer screening usually begins at 42 years of age and continues through 42 years of age.  Your health care provider may recommend screening at an earlier age if you have risk factors for colon cancer.  Your health care provider may also recommend using home test kits to check for hidden blood in the stool.  A small camera at the end of a tube can be used to examine your colon directly (sigmoidoscopy or colonoscopy). This is done to check for the earliest forms of colorectal cancer.  Routine  screening usually begins at age 41.  Direct examination of the colon should be repeated every 5-10 years through 42 years of age. However, you may need to be screened more often if early forms of precancerous polyps or small growths are found. Skin Cancer  Check your skin from head to toe regularly.  Tell your health care provider about any new moles or changes in moles, especially if there is a change in a mole's shape or color.  Also tell your health care provider if you have a mole that is larger than the size of a pencil eraser.  Always use sunscreen. Apply sunscreen liberally and repeatedly throughout the day.  Protect yourself by wearing long sleeves, pants, a wide-brimmed hat, and sunglasses whenever you are outside. Heart disease, diabetes, and high blood pressure  High blood pressure causes heart disease and increases the risk of stroke. High blood pressure is more likely to develop in:  People who have blood pressure in the high end of the normal range (130-139/85-89 mm Hg).  People who are overweight or obese.  People who are African American.  If you are 59-24 years of age, have your blood pressure checked every 3-5 years. If you are 34 years of age or older, have your blood pressure checked every year. You should have your blood pressure measured twice-once when you are at a hospital or clinic, and once when you are not at a hospital or clinic. Record the average of the two measurements. To check your blood pressure when you are not at a hospital or clinic, you can use:  An automated blood pressure machine at a pharmacy.  A home blood pressure monitor.  If you are between 29 years and 60 years old, ask your health care provider if you should take aspirin to prevent strokes.  Have regular diabetes screenings. This involves taking a blood sample to check your fasting blood sugar level.  If you are at a normal weight and have a low risk for diabetes, have this test once  every three years after 42 years of age.  If you are overweight and have a high risk for diabetes, consider being tested at a younger age or more often. Preventing infection Hepatitis B  If you have a higher risk for hepatitis B, you should be screened for this virus. You are considered at high risk for hepatitis B if:  You were born in a country where hepatitis B is common. Ask your health care provider which countries are considered high risk.  Your parents were born in a high-risk country, and you have not been immunized against hepatitis B (hepatitis B vaccine).  You have HIV or AIDS.  You use needles to inject street drugs.  You live with someone who has hepatitis B.  You have had sex with someone who has hepatitis B.  You get hemodialysis treatment.  You take certain medicines for conditions, including cancer, organ transplantation, and autoimmune conditions. Hepatitis C  Blood testing is recommended for:  Everyone born from [birth year] through 1966.  Anyone with known risk factors for hepatitis C. Sexually transmitted infections (STIs)  You should be screened for sexually transmitted infections (STIs) including gonorrhea and chlamydia if:  You are sexually active and are younger than 42 years of age.  You are older than 42 years of age and your health care provider tells you that you are at risk for this type of infection.  Your sexual activity has changed since you were last screened and you are at an increased risk for chlamydia or gonorrhea. Ask your health care provider if you are at risk.  If you do not have HIV, but are at risk, it may be recommended that you take a prescription medicine daily to prevent HIV infection. This is called pre-exposure prophylaxis (PrEP). You are considered at risk if:  You are sexually active and do not regularly use condoms or know the HIV status of your partner(s).  You take drugs by injection.  You are sexually active with a partner  who has HIV. Talk with your health care provider about whether you are at high risk of being infected with HIV. If you choose to begin PrEP, you should first be tested for HIV. You should then be tested every 3 months for as long as you are taking PrEP. Pregnancy  If you are premenopausal and you may become pregnant, ask your health care provider about preconception counseling.  If you may become pregnant, take 400 to 800 micrograms (mcg) of folic acid  every day.  If you want to prevent pregnancy, talk to your health care provider about birth control (contraception). Osteoporosis and menopause  Osteoporosis is a disease in which the bones lose minerals and strength with aging. This can result in serious bone fractures. Your risk for osteoporosis can be identified using a bone density scan.  If you are 4 years of age or older, or if you are at risk for osteoporosis and fractures, ask your health care provider if you should be screened.  Ask your health care provider whether you should take a calcium or vitamin D supplement to lower your risk for osteoporosis.  Menopause may have certain physical symptoms and risks.  Hormone replacement therapy may reduce some of these symptoms and risks. Talk to your health care provider about whether hormone replacement therapy is right for you. Follow these instructions at home:  Schedule regular health, dental, and eye exams.  Stay current with your immunizations.  Do not use any tobacco products including cigarettes, chewing tobacco, or electronic cigarettes.  If you are pregnant, do not drink alcohol.  If you are breastfeeding, limit how much and how often you drink alcohol.  Limit alcohol intake to no more than 1 drink per day for nonpregnant women. One drink equals 12 ounces of beer, 5 ounces of wine, or 1 ounces of hard liquor.  Do not use street drugs.  Do not share needles.  Ask your health care provider for help if you need support  or information about quitting drugs.  Tell your health care provider if you often feel depressed.  Tell your health care provider if you have ever been abused or do not feel safe at home. This information is not intended to replace advice given to you by your health care provider. Make sure you discuss any questions you have with your health care provider. Document Released: 11/28/2010 Document Revised: 10/21/2015 Document Reviewed: 02/16/2015 Elsevier Interactive Patient Education  2017 [employer].

## 2016-08-28 LAB — PAP IG AND HPV HIGH-RISK: HPV DNA HIGH RISK: NOT DETECTED

## 2018-04-29 ENCOUNTER — Ambulatory Visit
Admission: EM | Admit: 2018-04-29 | Discharge: 2018-04-29 | Disposition: A | Discharge status: Home / Self Care | Payer: BLUE CROSS/BLUE SHIELD | Attending: Family Medicine | Admitting: Family Medicine

## 2018-04-29 ENCOUNTER — Other Ambulatory Visit: Payer: Self-pay

## 2018-04-29 DIAGNOSIS — J069 Acute upper respiratory infection, unspecified: Secondary | ICD-10-CM

## 2018-04-29 DIAGNOSIS — R062 Wheezing: Secondary | ICD-10-CM

## 2018-04-29 DIAGNOSIS — B9789 Other viral agents as the cause of diseases classified elsewhere: Secondary | ICD-10-CM

## 2018-04-29 MED ORDER — HYDROCOD POLST-CPM POLST ER 10-8 MG/5ML PO SUER: 5.0000 mL | Freq: Every evening | ORAL | 0 refills | Status: DC | PRN

## 2018-04-29 MED ORDER — PREDNISONE 20 MG PO TABS: 40.0000 mg | ORAL_TABLET | Freq: Every day | ORAL | 0 refills | Status: DC

## 2018-04-29 MED ORDER — ALBUTEROL SULFATE HFA 108 (90 BASE) MCG/ACT IN AERS: 2.0000 | INHALATION_SPRAY | RESPIRATORY_TRACT | 0 refills | Status: DC | PRN

## 2018-04-29 MED ORDER — DOXYCYCLINE HYCLATE 100 MG PO CAPS: 100.0000 mg | ORAL_CAPSULE | Freq: Two times a day (BID) | ORAL | 0 refills | Status: DC

## 2018-04-29 MED ORDER — BENZONATATE 100 MG PO CAPS: 100.0000 mg | ORAL_CAPSULE | Freq: Three times a day (TID) | ORAL | 0 refills | Status: DC | PRN

## 2018-04-29 NOTE — Discharge Instructions (Signed)
Take medication as prescribed. Rest. Drink plenty of fluids.  ° °Follow up with your primary care physician this week as needed. Return to Urgent care for new or worsening concerns.  ° °

## 2018-04-29 NOTE — ED Triage Notes (Signed)
Patient complains of cough, congestion, unable to sleep due to cough, sore throat and diarrhea. Patient states that symptoms started 2 weeks ago but seemed to worsen on Friday.

## 2018-04-29 NOTE — ED Provider Notes (Signed)
[facility] ____________________________________________  Time seen: Approximately 9:30 AM  I have reviewed the triage vital signs and the nursing notes.   HISTORY  Chief Complaint Cough   HPI Jenna Price is a 42 y.o. female presenting for evaluation of 2 weeks of cough and chest congestion.  States initially her son came home sick, and states that it has since gone through the household.  Reports others in the house have been feeling better however her symptoms continue.  Reports continued cough and chest congestion, cough is sometimes productive, often dry.  Does occasionally hear herself wheeze.  Cough tends to happen and coughing fits.  Cough is disrupting sleep.  Denies known fevers.  Continues to overall eat and drink well.  Has continue to remain active.  Unresolved with multiple over-the-counter cough and congestion medications.  Reports started to have sore throat and some intermittent diarrhea 3 days ago.  States sore throat is mild currently.  Denies sinus pain or sinus congestion.  Denies chest pain or shortness of breath.  Does feel sore from coughing.  Denies other aggravating alleviating factors.  Denies history of asthma or recurrent respiratory illnesses.  Reports otherwise doing well.  Kerman PasseyLada, Melinda P, MD: PCP  Patient's last menstrual period was 04/24/2018.   Past Medical History:  Diagnosis Date  . Morbid obesity (HCC) 08/03/2016  . MRSA (methicillin resistant staph aureus) culture positive   . Obesity     Patient Active Problem List   Diagnosis Date Noted  . Preventative health care 08/22/2016  . Obesity (BMI 35.0-39.9 without comorbidity) 08/22/2016  . Cervical cancer screening 08/22/2016  . Abnormal mammogram of left breast 04/14/2016    Past Surgical History:  Procedure Laterality Date  . NO PAST SURGERIES       No current facility-administered medications for this encounter.   Current Outpatient Medications:  .  albuterol (PROVENTIL  HFA;VENTOLIN HFA) 108 (90 Base) MCG/ACT inhaler, Inhale 2 puffs into the lungs every 4 (four) hours as needed for wheezing., Disp: 1 Inhaler, Rfl: 0 .  benzonatate (TESSALON PERLES) 100 MG capsule, Take 1 capsule (100 mg total) by mouth 3 (three) times daily as needed., Disp: 15 capsule, Rfl: 0 .  chlorpheniramine-HYDROcodone (TUSSIONEX PENNKINETIC ER) 10-8 MG/5ML SUER, Take 5 mLs by mouth at bedtime as needed for cough. do not drive or operate machinery while taking as can cause drowsiness., Disp: 50 mL, Rfl: 0 .  doxycycline (VIBRAMYCIN) 100 MG capsule, Take 1 capsule (100 mg total) by mouth 2 (two) times daily., Disp: 20 capsule, Rfl: 0 .  predniSONE (DELTASONE) 20 MG tablet, Take 2 tablets (40 mg total) by mouth daily., Disp: 10 tablet, Rfl: 0  Allergies Patient has no known allergies.  Family History  Problem Relation Age of Onset  . Breast cancer Maternal Aunt        mat great aunt  . Drug abuse Mother   . Heart disease Mother     Social History Social History   Tobacco Use  . Smoking status: Never Smoker  . Smokeless tobacco: Never Used  Substance Use Topics  . Alcohol use: No    Alcohol/week: 0.0 standard drinks  . Drug use: No    Review of Systems Constitutional: No fever ENT: as above.  Cardiovascular: Denies chest pain. Respiratory: Denies shortness of breath. Gastrointestinal: No abdominal pain.   Musculoskeletal: Negative for back pain. Skin: Negative for rash.  ____________________________________________   PHYSICAL EXAM:  VITAL SIGNS: ED Triage Vitals  Enc Vitals Group  BP 04/29/18 0846 (!) 145/99     Pulse Rate 04/29/18 0846 (!) 118     Resp 04/29/18 0846 18     Temp 04/29/18 0846 98.8 F (37.1 C)     Temp Source 04/29/18 0846 Oral     SpO2 04/29/18 0846 99 %     Weight 04/29/18 0844 200 lb (90.7 kg)     Height 04/29/18 0844 5\' 3"  (1.6 m)     Head Circumference --      Peak Flow --      Pain Score 04/29/18 0844 7     Pain Loc --      Pain  Edu? --      Excl. in GC? --     Constitutional: Alert and oriented. Well appearing and in no acute distress. Eyes: Conjunctivae are normal.  Head: Atraumatic. No sinus tenderness to palpation. No swelling. No erythema.  Ears: no erythema, normal TMs bilaterally.   Nose:Nasal congestion   Mouth/Throat: Mucous membranes are moist. Mild pharyngeal erythema. No tonsillar swelling or exudate.  Neck: No stridor.  No cervical spine tenderness to palpation. Hematological/Lymphatic/Immunilogical: No cervical lymphadenopathy. Cardiovascular: Normal rate, regular rhythm. Grossly normal heart sounds.  Good peripheral circulation. Respiratory: Normal respiratory effort.  No retractions.Good air movement.  Mild scattered expiratory wheezes.  Scattered rhonchi.  No focal area of consolidation.  Dry intermittent cough noted in room with bronchospasm.  Speaks in complete sentences. Musculoskeletal: Ambulatory with steady gait.  No lower extremity edema noted bilaterally. Neurologic:  Normal speech and language. No gait instability. Skin:  Skin appears warm, dry and intact. No rash noted. Psychiatric: Mood and affect are normal. Speech and behavior are normal.  ___________________________________________   LABS (all labs ordered are listed, but only abnormal results are displayed)  Labs Reviewed - No data to display ____________________________________________   PROCEDURES Procedures   INITIAL IMPRESSION / ASSESSMENT AND PLAN / ED COURSE  Pertinent labs & imaging results that were available during my care of the patient were reviewed by me and considered in my medical decision making (see chart for details).  Overall well-appearing patient.  No acute distress.  Suspect recent viral illness, concern for secondary infection.  Discussed evaluation of chest x-ray, will defer at this time.  Will treat with doxycycline, prednisone, albuterol, Tessalon Perles and Tussionex.  Encourage rest, fluids,  supportive care.  Discussed strict follow-up and reevaluation.Discussed indication, risks and benefits of medications with patient.  Discussed follow up with Primary care physician this week. Discussed follow up and return parameters including no resolution or any worsening concerns. Patient verbalized understanding and agreed to plan.   ____________________________________________   FINAL CLINICAL IMPRESSION(S) / ED DIAGNOSES  Final diagnoses:  Viral URI with cough  Wheezing     ED Discharge Orders         Ordered    benzonatate (TESSALON PERLES) 100 MG capsule  3 times daily PRN     04/29/18 0928    chlorpheniramine-HYDROcodone (TUSSIONEX PENNKINETIC ER) 10-8 MG/5ML SUER  At bedtime PRN     04/29/18 0928    doxycycline (VIBRAMYCIN) 100 MG capsule  2 times daily     04/29/18 0928    predniSONE (DELTASONE) 20 MG tablet  Daily     04/29/18 0928    albuterol (PROVENTIL HFA;VENTOLIN HFA) 108 (90 Base) MCG/ACT inhaler  Every 4 hours PRN     04/29/18 1610           Note: This dictation was prepared with Reubin Milan  dictation along with smaller phrase technology. Any transcriptional errors that result from this process are unintentional.         Renford Dills, NP 04/29/18 1002

## 2018-05-12 DIAGNOSIS — R300 Dysuria: Secondary | ICD-10-CM | POA: Diagnosis not present

## 2018-05-12 DIAGNOSIS — R1084 Generalized abdominal pain: Secondary | ICD-10-CM | POA: Diagnosis not present

## 2018-05-12 DIAGNOSIS — N3001 Acute cystitis with hematuria: Secondary | ICD-10-CM | POA: Diagnosis not present

## 2018-05-12 DIAGNOSIS — R3915 Urgency of urination: Secondary | ICD-10-CM | POA: Diagnosis not present

## 2018-05-12 DIAGNOSIS — R000 Tachycardia, unspecified: Secondary | ICD-10-CM | POA: Diagnosis not present

## 2018-05-12 DIAGNOSIS — R350 Frequency of micturition: Secondary | ICD-10-CM | POA: Diagnosis not present

## 2018-11-18 ENCOUNTER — Encounter: Payer: Self-pay | Admitting: Emergency Medicine

## 2018-11-18 ENCOUNTER — Ambulatory Visit
Admission: EM | Admit: 2018-11-18 | Discharge: 2018-11-18 | Disposition: A | Discharge status: Home / Self Care | Payer: BC Managed Care – PPO | Attending: Urgent Care | Admitting: Urgent Care

## 2018-11-18 ENCOUNTER — Other Ambulatory Visit: Payer: Self-pay

## 2018-11-18 DIAGNOSIS — N3000 Acute cystitis without hematuria: Secondary | ICD-10-CM | POA: Diagnosis not present

## 2018-11-18 DIAGNOSIS — N926 Irregular menstruation, unspecified: Secondary | ICD-10-CM | POA: Diagnosis not present

## 2018-11-18 LAB — URINALYSIS, COMPLETE (UACMP) WITH MICROSCOPIC
Bilirubin Urine: NEGATIVE
Glucose, UA: NEGATIVE mg/dL
Ketones, ur: NEGATIVE mg/dL
Nitrite: NEGATIVE
Protein, ur: NEGATIVE mg/dL
Specific Gravity, Urine: >1.03 — ABNORMAL HIGH (ref 1.005–1.030)
WBC, UA: >50 WBC/hpf (ref 0–5)
pH: 5 (ref 5.0–8.0)

## 2018-11-18 LAB — PREGNANCY, URINE: Preg Test, Ur: NEGATIVE

## 2018-11-18 MED ORDER — PHENAZOPYRIDINE HCL 200 MG PO TABS: 200.0000 mg | ORAL_TABLET | Freq: Three times a day (TID) | ORAL | 0 refills | Status: DC | PRN

## 2018-11-18 MED ORDER — SULFAMETHOXAZOLE-TRIMETHOPRIM 800-160 MG PO TABS: 1.0000 | ORAL_TABLET | Freq: Two times a day (BID) | ORAL | 0 refills | Status: AC

## 2018-11-18 NOTE — ED Provider Notes (Addendum)
Name: Jenna Price DOB: [birth year] MRN: [id number] CSN: [id number] PCP: Arnetha Courser, MD  Arrival date and time:  11/18/18 1050  Chief Complaint:  Recurrent UTI  NOTE: Prior to seeing the patient today, I have reviewed the triage nursing documentation and vital signs. Clinical staff has updated patient's PMH/PSHx, current medication list, and drug allergies/intolerances to ensure comprehensive history available to assist in medical decision making.   History:   HPI: Jenna Price is a 43 y.o. female who presents today with complaints of urinary symptoms that began on 11/16/2018. She complains of dysuria, frequency, and voiding small amounts.  She has not appreciated any gross hematuria, nor has she noticed her urine being malodorous. Patient denies any associated nausea, vomiting, fever, and chills. She is eating and drinking well. She has not experienced any pain in her lower back or flank, but notes significant discomfort/pressure in her suprapubic area. No vaginal pain, bleeding, or discharge. Patient also noting a history of irregular menstrual cycles as of late. She is questioning pregnancy.  Patient advising nursing staff that she was treated at [facility] approximately 6 weeks ago for the same and the infection "never really went away". In review of the available records in [place] from [facility] patient was last seen and treated for a urinary tract infection on 05/13/2018. Urine culture grew out [employer], and she was appropriately treated with a course of cephalexin.   She presents to the clinic today tachycardic (114 bpm) and hypertensive (146/103). No associated chest pain, shortness of breath, or palpitations.    Past Medical History:  Diagnosis Date  . Morbid obesity ([place]) 08/03/2016  . MRSA (methicillin resistant staph aureus) culture positive   . Obesity     Past Surgical History:  Procedure Laterality Date  . NO PAST SURGERIES      Family History  Problem Relation  Age of Onset  . Breast cancer Maternal Aunt        mat great aunt  . Drug abuse Mother   . Heart disease Mother     Social History   Socioeconomic History  . Marital status: Married    Spouse name: Not on file  . Number of children: Not on file  . Years of education: Not on file  . Highest education level: Not on file  Occupational History  . Not on file  Social Needs  . Financial resource strain: Not on file  . Food insecurity    Worry: Not on file    Inability: Not on file  . Transportation needs    Medical: Not on file    Non-medical: Not on file  Tobacco Use  . Smoking status: Never Smoker  . Smokeless tobacco: Never Used  Substance and Sexual Activity  . Alcohol use: No    Alcohol/week: 0.0 standard drinks  . Drug use: No  . Sexual activity: Not on file  Lifestyle  . Physical activity    Days per week: Not on file    Minutes per session: Not on file  . Stress: Not on file  Relationships  . Social Herbalist on phone: Not on file    Gets together: Not on file    Attends religious service: Not on file    Active member of club or organization: Not on file    Attends meetings of clubs or organizations: Not on file    Relationship status: Not on file  . Intimate partner violence  Fear of current or ex partner: Not on file    Emotionally abused: Not on file    Physically abused: Not on file    Forced sexual activity: Not on file  Other Topics Concern  . Not on file  Social History Narrative  . Not on file    Patient Active Problem List   Diagnosis Date Noted  . Preventative health care 08/22/2016  . Obesity (BMI 35.0-39.9 without comorbidity) 08/22/2016  . Cervical cancer screening 08/22/2016  . Abnormal mammogram of left breast 04/14/2016    Home Medications:    Current Meds  Medication Sig  . albuterol (PROVENTIL HFA;VENTOLIN HFA) 108 (90 Base) MCG/ACT inhaler Inhale 2 puffs into the lungs every 4 (four) hours as needed for wheezing.   . [DISCONTINUED] predniSONE (DELTASONE) 20 MG tablet Take 2 tablets (40 mg total) by mouth daily.    Allergies:   Patient has no known allergies.  Review of Systems (ROS): Review of Systems  Constitutional: Negative for chills and fever.  Respiratory: Negative for cough and shortness of breath.   Cardiovascular: Negative for chest pain and palpitations.  Gastrointestinal: Positive for abdominal pain (suprapubic). Negative for diarrhea, nausea and vomiting.  Genitourinary: Positive for decreased urine volume, dysuria, frequency and menstrual problem (irregular periods over last 4-6 months). Negative for flank pain, hematuria, pelvic pain, urgency, vaginal bleeding, vaginal discharge and vaginal pain.  Musculoskeletal: Negative for back pain and myalgias.  Skin: Negative.   Neurological: Negative for dizziness, weakness and headaches.     Physical Exam:  Triage Vital Signs ED Triage Vitals  Enc Vitals Group     BP 11/18/18 1114 (!) 146/103     Pulse Rate 11/18/18 1114 (!) 114     Resp 11/18/18 1114 18     Temp 11/18/18 1114 98.4 F (36.9 C)     Temp Source 11/18/18 1114 Oral     SpO2 11/18/18 1114 100 %     Weight 11/18/18 1116 220 lb (99.8 kg)     Height 11/18/18 1116 5\' 3"  (1.6 m)     Head Circumference --      Peak Flow --      Pain Score 11/18/18 1115 5     Pain Loc --      Pain Edu? --      Excl. in GC? --     Physical Exam  Constitutional: She is oriented to person, place, and time and well-developed, well-nourished, and in no distress.  HENT:  Head: Normocephalic and atraumatic.  Mouth/Throat: Oropharynx is clear and moist and mucous membranes are normal.  Cardiovascular: Regular rhythm, normal heart sounds and intact distal pulses. Tachycardia present. Exam reveals no gallop and no friction rub.  No murmur heard. Pulmonary/Chest: Effort normal and breath sounds normal. No respiratory distress. She has no wheezes. She has no rales.  Abdominal: Bowel sounds are  normal. There is abdominal tenderness in the suprapubic area. There is no CVA tenderness.  Neurological: She is alert and oriented to person, place, and time.  Skin: Skin is warm and dry. No rash noted. No erythema.  Psychiatric: Mood, affect and judgment normal.  Nursing note and vitals reviewed.    Urgent Care Treatments / Results:   LABS: PLEASE NOTE: all labs that were ordered this encounter are listed, however only abnormal results are displayed. Labs Reviewed  URINALYSIS, COMPLETE (UACMP) WITH MICROSCOPIC - Abnormal; Notable for the following components:      Result Value   Specific Gravity, Urine >1.030 (*)  Hgb urine dipstick SMALL (*)    Leukocytes,Ua SMALL (*)    Bacteria, UA RARE (*)    All other components within normal limits  URINE CULTURE  PREGNANCY, URINE    EKG: -None  RADIOLOGY: No results found.  PROCEDURES: Procedures  MEDICATIONS RECEIVED THIS VISIT: Medications - No data to display  PERTINENT CLINICAL COURSE NOTES/UPDATES:   Initial Impression / Assessment and Plan / Urgent Care Course:    Lorrene Reidonya L Welz is a 43 y.o. female who [facility] with complaints of Recurrent UTI   Pertinent labs & imaging results that were available during my care of the patient were personally reviewed by me and considered in my medical decision making (see lab/imaging section of note for values and interpretations).  Patient overall well appearing and in no acute distress today in clinic. Exam reveals suprapubic tenderness. VS reassessed and found to be normal after patient seated in quiet clinical area; see VSFS. No CVAT on exam. Denies nausea, vomiting, and fever. UA (+) for bacteria and leukocytes; reflex culture sent. Patient advising that her urinary tract infections have been difficult to treat in the past; "I was so sick the last time. I almost wound up in the hospital". In light of her symptoms, will proceed with a 5 day course of  SMZ-TMP. Patient will be given Pyridium for PRN use for her dysuria. She was encouraged to increased her fluid intake to flush out the urinary tract. Educated that caffeine can cause an increase in painful bladder spasms. May use Tylenol and/or Ibuprofen as needed for pain/fever.   Regarding her irregular menstrual cycles, her urine HCG level today was negative for current pregnancy. There is no associated vaginal/pelvic pain. Given her age, her irregular menstrual cycles could potentially be related to perimenopausal state. Encouraged patient to discuss having FSH and estradiol levels checked by PCP or GYN at her next visit, as there is no clear indication of having these labs obtained today. There is concern that insurance will not cover this type of testing in the UCC/emergency care settings.   Discussed follow up with primary care physician in 1 week for re-evaluation. I have reviewed the follow up and strict return precautions for any new or worsening symptoms. Patient is aware of symptoms that would be deemed urgent/emergent, and would thus require further evaluation either here or in the emergency department. At the time of discharge, she verbalized understanding and consent with the discharge plan as it was reviewed with her. All questions were fielded by provider and/or clinic staff prior to patient discharge.    Final Clinical Impressions(s) / Urgent Care Diagnoses:   Final diagnoses:  Acute cystitis without hematuria  Irregular periods/menstrual cycles    New Prescriptions:   Meds ordered this encounter  Medications  . sulfamethoxazole-trimethoprim (BACTRIM DS) 800-160 MG tablet    Sig: Take 1 tablet by mouth 2 (two) times daily for 5 days.    Dispense:  10 tablet    Refill:  0  . phenazopyridine (PYRIDIUM) 200 MG tablet    Sig: Take 1 tablet (200 mg total) by mouth 3 (three) times daily as needed for pain.    Dispense:  9 tablet    Refill:  0    Controlled Substance  Prescriptions:  [place] Controlled Substance Registry consulted? Not Applicable  NOTE: This note was prepared using Dragon dictation software along with smaller phrase technology. Despite my best ability to proofread, there is the potential that transcriptional errors  may still occur from this process, and are completely unintentional.    Verlee MonteGray, Luka Stohr E, NP 11/19/18 0030

## 2018-11-18 NOTE — ED Triage Notes (Signed)
Patient states she had a UTI and was treated at [facility] about 6 weeks ago. States the symptoms never completely went away.  Patient states the symptoms have gotten worse over the weekend. Urinary frequency and ugency as well as pain.

## 2018-11-18 NOTE — Discharge Instructions (Addendum)
It was very nice seeing you today in clinic. Thank you for entrusting me with your care.   As discussed, your urine is POSITIVE for infection. Will approach treatment as follows:  Prescription has been sent to your pharmacy for antibiotics.  Please pick up and take as directed. FINISH the entire course of medication even if you are feeling better.  A culture will be sent on your provided sample. If it comes back resistant to what I have prescribed you, someone will call you and let you know that we will need to change antibiotics. Increase fluid intake as much as possible to flush your urinary tract.  Water is always the best.  Avoid caffeine until your infection clears up, as it can contribute to painful bladder spasms.  May use Tylenol and/or Ibuprofen as needed for pain/fever. May use Azo products (over the counter) to help with pain/discomfort.   Make arrangements to follow up with your regular doctor in 1 week for re-evaluation. If your symptoms/condition worsens, please seek follow up care either here or in the ER. Please remember, our [place] providers are "right here with you" when you need us.   Again, it was my pleasure to take care of you today. Thank you for choosing our clinic. I hope that you start to feel better quickly.   Atilla Zollner, MSN, APRN, FNP-C, CEN Advanced Practice Provider [facility] 

## 2018-11-20 LAB — URINE CULTURE: Culture: >=100000 — AB

## 2018-11-22 ENCOUNTER — Telehealth (HOSPITAL_COMMUNITY): Payer: Self-pay | Admitting: Emergency Medicine

## 2018-11-22 NOTE — Telephone Encounter (Signed)
Urine culture was positive for e coli and was given bactrim  at urgent care visit. Pt contacted and made aware, educated on completing antibiotic and to follow up if symptoms are persistent. Verbalized understanding.    

## 2018-12-04 DIAGNOSIS — Z1159 Encounter for screening for other viral diseases: Secondary | ICD-10-CM | POA: Diagnosis not present

## 2018-12-12 ENCOUNTER — Other Ambulatory Visit (HOSPITAL_COMMUNITY)
Admission: RE | Admit: 2018-12-12 | Discharge: 2018-12-12 | Disposition: A | Discharge status: Home / Self Care | Payer: BC Managed Care – PPO | Source: Ambulatory Visit | Attending: Nurse Practitioner | Admitting: Nurse Practitioner

## 2018-12-12 ENCOUNTER — Other Ambulatory Visit: Payer: Self-pay

## 2018-12-12 ENCOUNTER — Ambulatory Visit (INDEPENDENT_AMBULATORY_CARE_PROVIDER_SITE_OTHER): Payer: BLUE CROSS/BLUE SHIELD | Admitting: Nurse Practitioner

## 2018-12-12 ENCOUNTER — Encounter: Payer: Self-pay | Admitting: Nurse Practitioner

## 2018-12-12 DIAGNOSIS — Z114 Encounter for screening for human immunodeficiency virus [HIV]: Secondary | ICD-10-CM | POA: Diagnosis not present

## 2018-12-12 DIAGNOSIS — N939 Abnormal uterine and vaginal bleeding, unspecified: Secondary | ICD-10-CM

## 2018-12-12 DIAGNOSIS — R399 Unspecified symptoms and signs involving the genitourinary system: Secondary | ICD-10-CM | POA: Diagnosis not present

## 2018-12-12 DIAGNOSIS — Z113 Encounter for screening for infections with a predominantly sexual mode of transmission: Secondary | ICD-10-CM

## 2018-12-12 DIAGNOSIS — R6889 Other general symptoms and signs: Secondary | ICD-10-CM | POA: Diagnosis not present

## 2018-12-12 DIAGNOSIS — R350 Frequency of micturition: Secondary | ICD-10-CM

## 2018-12-12 DIAGNOSIS — N926 Irregular menstruation, unspecified: Secondary | ICD-10-CM

## 2018-12-12 LAB — POCT URINALYSIS DIPSTICK
Bilirubin, UA: NEGATIVE
Blood, UA: POSITIVE
Glucose, UA: NEGATIVE
Ketones, UA: NEGATIVE
Leukocytes, UA: NEGATIVE
Nitrite, UA: NEGATIVE
Protein, UA: NEGATIVE
Spec Grav, UA: 1.02 (ref 1.010–1.025)
Urobilinogen, UA: NEGATIVE E.U./dL — AB
pH, UA: 6.5 (ref 5.0–8.0)

## 2018-12-12 NOTE — Progress Notes (Signed)
Virtual Visit via Video Note  I connected with Renie Oraonya Largo on 12/12/18 at  2:00 PM EDT by a video enabled telemedicine application and verified that I am speaking with the correct person using two identifiers.   Staff discussed the limitations of evaluation and management by telemedicine and the availability of in person appointments. The patient expressed understanding and agreed to proceed.  Patient location: home  My location: work office Other people present: none HPI  Abnormal menses Has had normal menses in the past- march and April had 2 cycles in one month, may and June did not have a cycle. Had a pregnancy test towards the end of June when she was diagnosed with UTI then that was negative. Last day of June started a period and has been bleeding for the last 17 days- states has been progressively getting worse and getting blood clots. Has used about 26 tampons in the last 2 days. States Tuesday night has mild lower abdominal cramping. Has not had any other abdominal cramping since.    Not on birth control  She is sexually active, not in monogamous relationship.     PHQ2/9: Depression screen [facility] 12/12/2018 08/22/2016 08/03/2016 11/29/2015 11/29/2015  Decreased Interest 0 0 0 - 0  Down, Depressed, Hopeless 0 0 0 2 0  PHQ - 2 Score 0 0 0 2 0  Altered sleeping 0 - - 3 -  Tired, decreased energy 0 - - 3 -  Change in appetite 0 - - 3 -  Feeling bad or failure about yourself  0 - - 3 -  Trouble concentrating 0 - - 2 -  Moving slowly or fidgety/restless 0 - - 0 -  Suicidal thoughts 0 - - 0 -  PHQ-9 Score 0 - - 16 -  Difficult doing work/chores Not difficult at all - - Extremely dIfficult -     PHQ reviewed. Negative  Patient Active Problem List   Diagnosis Date Noted  . Preventative health care 08/22/2016  . Obesity (BMI 35.0-39.9 without comorbidity) 08/22/2016  . Cervical cancer screening 08/22/2016  . Abnormal mammogram of left breast 04/14/2016    Past Medical History:   Diagnosis Date  . Morbid obesity ([facility]) 08/03/2016  . MRSA (methicillin resistant staph aureus) culture positive   . Obesity     Past Surgical History:  Procedure Laterality Date  . NO PAST SURGERIES      Social History   Tobacco Use  . Smoking status: Never Smoker  . Smokeless tobacco: Never Used  Substance Use Topics  . Alcohol use: No    Alcohol/week: 0.0 standard drinks     Current Outpatient Medications:  .  albuterol (PROVENTIL HFA;VENTOLIN HFA) 108 (90 Base) MCG/ACT inhaler, Inhale 2 puffs into the lungs every 4 (four) hours as needed for wheezing., Disp: 1 Inhaler, Rfl: 0 .  phenazopyridine (PYRIDIUM) 200 MG tablet, Take 1 tablet (200 mg total) by mouth 3 (three) times daily as needed for pain., Disp: 9 tablet, Rfl: 0  No Known Allergies  ROS   No other specific complaints in a complete review of systems (except as listed in HPI above).  Objective  There were no vitals filed for this visit.   There is no height or weight on file to calculate BMI.  Nursing Note and Vital Signs reviewed.  Physical Exam   Constitutional: Patient appears well-developed and well-nourished. No distress.  HENT: Head: Normocephalic and atraumatic. Pulmonary/Chest: Effort normal  Musculoskeletal: Normal range of motion,  Neurological: alert  and oriented, speech normal.  Abdomen: non-tender Psychiatric: Patient has a normal mood and affect. behavior is normal. Judgment and thought content normal.    Assessment & Plan  1. Abnormal uterine bleeding - CBC with Differential - TSH - POCT Urinalysis Dipstick - Urine Culture - Urine cytology ancillary only - hCG, serum, qualitative  2. Screening for STD (sexually transmitted disease) - HIV antibody (with reflex) - RPR - Hepatitis panel, acute - Urine cytology ancillary only  3. Urinary frequency - POCT Urinalysis Dipstick - Urine Culture    Follow Up Instructions:    I discussed the assessment and treatment plan  with the patient. The patient was provided an opportunity to ask questions and all were answered. The patient agreed with the plan and demonstrated an understanding of the instructions.   The patient was advised to call back or seek an in-person evaluation if the symptoms worsen or if the condition fails to improve as anticipated.  I provided 24 minutes of non-face-to-face time during this encounter.   Fredderick Severance, NP

## 2018-12-14 LAB — URINE CULTURE
MICRO NUMBER:: 675576
SPECIMEN QUALITY:: ADEQUATE

## 2018-12-14 LAB — CBC WITH DIFFERENTIAL/PLATELET
Absolute Monocytes: 590 cells/uL (ref 200–950)
Basophils Absolute: 79 cells/uL (ref 0–200)
Basophils Relative: 1.1 %
Eosinophils Absolute: 43 cells/uL (ref 15–500)
Eosinophils Relative: 0.6 %
HCT: 39.1 % (ref 35.0–45.0)
Hemoglobin: 13.4 g/dL (ref 11.7–15.5)
Lymphs Abs: 1973 cells/uL (ref 850–3900)
MCH: 29.6 pg (ref 27.0–33.0)
MCHC: 34.3 g/dL (ref 32.0–36.0)
MCV: 86.5 fL (ref 80.0–100.0)
MPV: 10 fL (ref 7.5–12.5)
Monocytes Relative: 8.2 %
Neutro Abs: 4514 cells/uL (ref 1500–7800)
Neutrophils Relative %: 62.7 %
Platelets: 315 10*3/uL (ref 140–400)
RBC: 4.52 10*6/uL (ref 3.80–5.10)
RDW: 13.2 % (ref 11.0–15.0)
Total Lymphocyte: 27.4 %
WBC: 7.2 10*3/uL (ref 3.8–10.8)

## 2018-12-14 LAB — HIV ANTIBODY (ROUTINE TESTING W REFLEX): HIV 1&2 Ab, 4th Generation: NONREACTIVE

## 2018-12-14 LAB — TSH: TSH: 2.05 mIU/L

## 2018-12-14 LAB — HCG, SERUM, QUALITATIVE: Preg, Serum: NEGATIVE

## 2018-12-14 LAB — RPR: RPR Ser Ql: NONREACTIVE

## 2018-12-16 ENCOUNTER — Other Ambulatory Visit: Payer: Self-pay | Admitting: Nurse Practitioner

## 2018-12-16 DIAGNOSIS — N939 Abnormal uterine and vaginal bleeding, unspecified: Secondary | ICD-10-CM

## 2018-12-17 LAB — URINE CYTOLOGY ANCILLARY ONLY
Chlamydia: NEGATIVE
Neisseria Gonorrhea: NEGATIVE
Trichomonas: NEGATIVE

## 2018-12-19 LAB — URINE CYTOLOGY ANCILLARY ONLY
Bacterial vaginitis: NEGATIVE
Candida vaginitis: NEGATIVE

## 2018-12-24 ENCOUNTER — Encounter: Payer: BLUE CROSS/BLUE SHIELD | Admitting: Nurse Practitioner

## 2019-01-06 ENCOUNTER — Other Ambulatory Visit: Payer: Self-pay

## 2019-01-06 ENCOUNTER — Ambulatory Visit (INDEPENDENT_AMBULATORY_CARE_PROVIDER_SITE_OTHER): Payer: BC Managed Care – PPO | Admitting: Obstetrics and Gynecology

## 2019-01-06 ENCOUNTER — Other Ambulatory Visit (HOSPITAL_COMMUNITY)
Admission: RE | Admit: 2019-01-06 | Discharge: 2019-01-06 | Disposition: A | Discharge status: Home / Self Care | Payer: BC Managed Care – PPO | Source: Ambulatory Visit | Attending: Obstetrics and Gynecology | Admitting: Obstetrics and Gynecology

## 2019-01-06 ENCOUNTER — Telehealth: Payer: Self-pay

## 2019-01-06 ENCOUNTER — Encounter: Payer: Self-pay | Admitting: Obstetrics and Gynecology

## 2019-01-06 VITALS — BP 128/84 | Ht 63.0 in | Wt 229.0 lb

## 2019-01-06 DIAGNOSIS — N921 Excessive and frequent menstruation with irregular cycle: Secondary | ICD-10-CM | POA: Diagnosis not present

## 2019-01-06 MED ORDER — MEDROXYPROGESTERONE ACETATE 10 MG PO TABS: 20.0000 mg | ORAL_TABLET | Freq: Three times a day (TID) | ORAL | 0 refills | Status: DC

## 2019-01-06 NOTE — Telephone Encounter (Signed)
Patient saw SDJ this morning & is at [facility] in [place] waiting to p/u her rx. The pharmacy hasn't received rx yet. [phone]

## 2019-01-06 NOTE — Progress Notes (Signed)
Obstetrics & Gynecology Office Visit   Chief Complaint  Patient presents with   Menstrual Problem  Referral from Suezanne Cheshire, NP, from [facility] for abnormal menstrual bleeding.  History of Present Illness: 43 y.o. [phone] female who presents in referral, as noted above.  She had two periods a month in March and April. In May she had no menses and she started having heavy bleeding at the end of June.  She has basically had "non-stop" bleeding since that time.  She states that she is probably going through a tampon every 2-3 hours.  She has been passing clots.  She denies cramping.  She had labs performed about three weeks ago, all of which were normal.  She had a normal CBC.  She had a normal TSH.  She had a normal pap smear 2.5 years ago with negative HPV.  She has had no imaging performed.  She denies weight loss.  She denies early satiety, bloating, constipation.  Denies visual change, galactorrhea, headaches.  Last mammogram: 2017: [facility] 1 Colonoscopy: never had   Past Medical History:  Diagnosis Date   Morbid obesity ([place]) 08/03/2016   MRSA (methicillin resistant staph aureus) culture positive    Obesity     Past Surgical History:  Procedure Laterality Date   NO PAST SURGERIES      Gynecologic History: Patient's last menstrual period was 11/26/2018.  Obstetric History: [id number], s/p SVD x 3, her husband has had a vasectomy  Family History  Problem Relation Age of Onset   Breast cancer Maternal Aunt        mat great aunt   Drug abuse Mother    Heart disease Mother     Social History   Socioeconomic History   Marital status: Married    Spouse name: Not on file   Number of children: Not on file   Years of education: Not on file   Highest education level: Not on file  Occupational History   Not on file  Social Needs   Financial resource strain: Not on file   Food insecurity    Worry: Not on file    Inability: Not on file    Transportation needs    Medical: Not on file    Non-medical: Not on file  Tobacco Use   Smoking status: Never Smoker   Smokeless tobacco: Never Used  Substance and Sexual Activity   Alcohol use: No    Alcohol/week: 0.0 standard drinks   Drug use: No   Sexual activity: Yes    Birth control/protection: None  Lifestyle   Physical activity    Days per week: Not on file    Minutes per session: Not on file   Stress: Not on file  Relationships   Social connections    Talks on phone: Not on file    Gets together: Not on file    Attends religious service: Not on file    Active member of club or organization: Not on file    Attends meetings of clubs or organizations: Not on file    Relationship status: Not on file   Intimate partner violence    Fear of current or ex partner: Not on file    Emotionally abused: Not on file    Physically abused: Not on file    Forced sexual activity: Not on file  Other Topics Concern   Not on file  Social History Narrative   Not on file    No Known Allergies  Prior  to Admission medications   Medication Sig Start Date End Date Taking? Authorizing Provider  albuterol (PROVENTIL HFA;VENTOLIN HFA) 108 (90 Base) MCG/ACT inhaler Inhale 2 puffs into the lungs every 4 (four) hours as needed for wheezing. 04/29/18  Yes Renford DillsMiller, Lindsey, NP  phenazopyridine (PYRIDIUM) 200 MG tablet Take 1 tablet (200 mg total) by mouth 3 (three) times daily as needed for pain. 11/18/18   Verlee MonteGray, Bryan E, NP    Review of Systems  Constitutional: Negative.   HENT: Negative.   Eyes: Negative.   Respiratory: Negative.   Cardiovascular: Negative.   Gastrointestinal: Negative.   Genitourinary: Negative.        See HPI  Musculoskeletal: Negative.   Skin: Negative.   Neurological: Negative.   Psychiatric/Behavioral: Negative.      Physical Exam BP 128/84    Ht 5\' 3"  (1.6 m)    Wt 229 lb (103.9 kg)    LMP 11/26/2018    BMI 40.57 kg/m  Patient's last menstrual  period was 11/26/2018. Physical Exam Constitutional:      General: She is not in acute distress.    Appearance: Normal appearance. She is well-developed.  Genitourinary:     Pelvic exam was performed with patient in the lithotomy position.     Vulva, inguinal canal, urethra, bladder, vagina, uterus, right adnexa and left adnexa normal.     No posterior fourchette tenderness, injury or lesion present.     Cervical bleeding (coming from inside external cervical os) present.     No cervical friability, lesion or polyp.  HENT:     Head: Normocephalic and atraumatic.  Eyes:     General: No scleral icterus.    Conjunctiva/sclera: Conjunctivae normal.  Neck:     Musculoskeletal: Normal range of motion and neck supple.  Cardiovascular:     Rate and Rhythm: Normal rate and regular rhythm.     Heart sounds: No murmur. No friction rub. No gallop.   Pulmonary:     Effort: Pulmonary effort is normal. No respiratory distress.     Breath sounds: Normal breath sounds. No wheezing or rales.  Abdominal:     General: Bowel sounds are normal. There is no distension.     Palpations: Abdomen is soft. There is no mass.     Tenderness: There is no abdominal tenderness. There is no guarding or rebound.  Musculoskeletal: Normal range of motion.  Neurological:     General: No focal deficit present.     Mental Status: She is alert and oriented to person, place, and time.     Cranial Nerves: No cranial nerve deficit.  Skin:    General: Skin is warm and dry.     Findings: No erythema.  Psychiatric:        Mood and Affect: Mood normal.        Behavior: Behavior normal.        Judgment: Judgment normal.  Vitals signs reviewed. Exam conducted with a chaperone present.    Endometrial Biopsy After discussion with the patient regarding her abnormal uterine bleeding I recommended that she proceed with an endometrial biopsy for further diagnosis. The risks, benefits, alternatives, and indications for an  endometrial biopsy were discussed with the patient in detail. She understood the risks including infection, bleeding, cervical laceration and uterine perforation.  Verbal consent was obtained.   PROCEDURE NOTE:  Pipelle endometrial biopsy was performed using aseptic technique with iodine preparation.  The uterus was sounded to a length of 8.5 cm.  Adequate  sampling was obtained with minimal blood loss.  The patient tolerated the procedure well.  Disposition will be pending pathology.  Female chaperone present for pelvic and breast  portions of the physical exam  Assessment: 43 y.o. No obstetric history on file. female here for  1. Menorrhagia with [id number] cycle      Plan: Problem List Items Addressed This Visit    None    Visit Diagnoses    Menorrhagia with irregular cycle    -  Primary   Relevant Medications   medroxyPROGESTERone (PROVERA) 10 MG tablet   Other Relevant Orders   US PELVIS TRANSVAGINAL NON-OB (TV ONLY)   Surgical pathology     Discussed management options for abnormal uterine bleeding including expectant, NSAIDs, tranexamic acid (Lysteda), oral progesterone (Provera, norethindrone, megace), Depo Provera, Levonorgestrel containing IUD, endometrial ablation (Novasure) or hysterectomy as definitive surgical management.  Discussed risks and benefits of each method.   Final management decision will hinge on results of patient's work up and whether an underlying etiology for the patients bleeding symptoms can be discerned.  We will conduct a basic work up examining using the PALM-COIEN classification system.  In the meantime the patient opts to trial Provera while we await results of her ultrasound and labs.  Printed patient education handouts were given to the patient to review at home.  Bleeding precautions reviewed.   Thomasene MohairStephen Tikita Mabee, MD 01/06/2019 2:08 PM     CC: Cheryle HorsfallPoulose, Elizabeth E, NP [address] [place],  [place] [id number]

## 2019-01-06 NOTE — Telephone Encounter (Signed)
RX was sent after SDJ saw AM patients. Sent on lunch break

## 2019-01-08 ENCOUNTER — Encounter: Payer: Self-pay | Admitting: Nurse Practitioner

## 2019-01-13 ENCOUNTER — Telehealth: Payer: Self-pay | Admitting: Obstetrics and Gynecology

## 2019-01-13 NOTE — Telephone Encounter (Signed)
Left generic VM. She may call me back or wait until her appt in 4 days.

## 2019-01-15 NOTE — Telephone Encounter (Signed)
Patient is calling for labs results. Please advise. 

## 2019-01-15 NOTE — Telephone Encounter (Signed)
Notified patient of normal biopsy results.  Encouraged her to keep ultrasound and follow-up appointment for this Friday.  She voiced understanding and agreement.

## 2019-01-17 ENCOUNTER — Other Ambulatory Visit: Payer: Self-pay

## 2019-01-17 ENCOUNTER — Encounter: Payer: Self-pay | Admitting: Obstetrics and Gynecology

## 2019-01-17 ENCOUNTER — Ambulatory Visit (INDEPENDENT_AMBULATORY_CARE_PROVIDER_SITE_OTHER): Payer: BC Managed Care – PPO

## 2019-01-17 ENCOUNTER — Ambulatory Visit (INDEPENDENT_AMBULATORY_CARE_PROVIDER_SITE_OTHER): Payer: BC Managed Care – PPO | Admitting: Obstetrics and Gynecology

## 2019-01-17 VITALS — BP 120/74 | Ht 63.0 in | Wt 230.0 lb

## 2019-01-17 DIAGNOSIS — N83201 Unspecified ovarian cyst, right side: Secondary | ICD-10-CM

## 2019-01-17 DIAGNOSIS — N921 Excessive and frequent menstruation with irregular cycle: Secondary | ICD-10-CM | POA: Diagnosis not present

## 2019-01-17 NOTE — Progress Notes (Signed)
Gynecology Ultrasound Follow Up   Chief Complaint  Patient presents with  . Follow-up  U/S for menorrhagia with irregular cycle   History of Present Illness: Patient is a 43 y.o. female who presents today for ultrasound evaluation of the above .  Ultrasound demonstrates the following findings Adnexa:  Right ovary with simple cyst 2.6 x 2.3 x 2.2 cm.   Uterus: anteverted with endometrial stripe  8.3 mm Additional: heterogeneous echotexture in myometrium with 2-3 mm simple cystic structures in the anterior portion.   - normal endometrial biopsy at last visit  Past Medical History:  Diagnosis Date  . Morbid obesity ([place]) 08/03/2016  . MRSA (methicillin resistant staph aureus) culture positive   . Obesity     Past Surgical History:  Procedure Laterality Date  . NO PAST SURGERIES      Family History  Problem Relation Age of Onset  . Breast cancer Maternal Aunt        mat great aunt  . Drug abuse Mother   . Heart disease Mother     Social History   Socioeconomic History  . Marital status: Married    Spouse name: Not on file  . Number of children: Not on file  . Years of education: Not on file  . Highest education level: Not on file  Occupational History  . Not on file  Social Needs  . Financial resource strain: Not on file  . Food insecurity    Worry: Not on file    Inability: Not on file  . Transportation needs    Medical: Not on file    Non-medical: Not on file  Tobacco Use  . Smoking status: Never Smoker  . Smokeless tobacco: Never Used  Substance and Sexual Activity  . Alcohol use: No    Alcohol/week: 0.0 standard drinks  . Drug use: No  . Sexual activity: Yes    Birth control/protection: None  Lifestyle  . Physical activity    Days per week: Not on file    Minutes per session: Not on file  . Stress: Not on file  Relationships  . Social Herbalist on phone: Not on file    Gets together: Not on file    Attends religious service: Not  on file    Active member of club or organization: Not on file    Attends meetings of clubs or organizations: Not on file    Relationship status: Not on file  . Intimate partner violence    Fear of current or ex partner: Not on file    Emotionally abused: Not on file    Physically abused: Not on file    Forced sexual activity: Not on file  Other Topics Concern  . Not on file  Social History Narrative  . Not on file    No Known Allergies  Prior to Admission medications   Medication Sig Start Date End Date Taking? Authorizing Provider  albuterol (PROVENTIL HFA;VENTOLIN HFA) 108 (90 Base) MCG/ACT inhaler Inhale 2 puffs into the lungs every 4 (four) hours as needed for wheezing. 04/29/18   Marylene Land, NP  medroxyPROGESTERone (PROVERA) 10 MG tablet Take 2 tablets (20 mg total) by mouth 3 (three) times daily for 7 days. 01/06/19 01/13/19  Will Bonnet, MD  phenazopyridine (PYRIDIUM) 200 MG tablet Take 1 tablet (200 mg total) by mouth 3 (three) times daily as needed for pain. 11/18/18   Karen Kitchens, NP    Physical Exam BP  120/74   Ht 5\' 3"  (1.6 m)   Wt 230 lb (104.3 kg)   BMI 40.74 kg/m    General: NAD HEENT: normocephalic, anicteric Pulmonary: No increased work of breathing Extremities: no edema, erythema, or tenderness Neurologic: Grossly intact, normal gait Psychiatric: mood appropriate, affect full  Imaging Results [place] Pelvis Transvaginal Non-ob (tv Only)  Result Date: 01/17/2019 Patient Name: Jenna Price DOB: Jan 07, 1976 MRN: [id number] ULTRASOUND REPORT Location: [facility] Date of Service: 01/17/2019 Indications:Abnormal Uterine Bleeding Findings: The uterus is anteverted and measures 9.96 x 5.2 x 5.8 cm. Echo texture is heterogenous without evidence of focal masses. There are some 2 to 3 mm simple cysts in the anterior myometrium. The Endometrium measures 8.3 mm. Right Ovary: measures 3.9 x 3.5 x 2.9 cm. It is normal in appearance. There is a simple cyst in the  right ovary measuring 26 x 23 x 22 mm. Left Ovary: measures 2.7 x 1.7 x 1.5 cm. It is normal in appearance. Survey of the adnexa demonstrates no adnexal masses. There is no free fluid in the cul de sac. Impression: 1. There are some 2 to 3 mm cysts in the anterior myometrium. Question adenomyosis. 2. There are no obvious endometrial polyps or fibroids. 3. There is a small simple cyst  In the right ovary. 4. Normal left ovary. Deanna ArtisElyse S Fairbanks, RT The ultrasound images and findings were reviewed by me and I agree with the above report. Thomasene MohairStephen Alphonsine Minium, MD, Merlinda FrederickFACOG [facility], [facility] 01/17/2019 4:15 PM       Assessment: [age] y.o. No obstetric history on file.  1. Menorrhagia with irregular cycle      Plan: Problem List Items Addressed This Visit    None    Visit Diagnoses    Menorrhagia with irregular cycle    -  Primary     Reviewed ultrasound findings today.  No obvious etiology noted with the possible exception of findings suggestive of adenomyosis.  We discussed treatment options for adenomyosis, which include Mirena intrauterine device and hysterectomy as the most effective, based on studies.  However, the diagnosis is not definitive and other options were discussed.  We discussed that she could continue to monitor and see how her symptoms persist.  Option 2 would be to treat hormonally with pills or an IUD.  Option 3 would be surgical with an endometrial ablation or hysterectomy.  We discussed that endometrial ablation may be much less effective if adenomyosis is present.  We also discussed that my preference is to try hormonal treatment prior to surgical treatment.  Mutual decision made to try oral combined contraceptive pills.  Taytulla samples given for 3 months.  If beneficial, will prescribe.  Otherwise will consider other treatments.  20 minutes spent in face to face discussion with > 50% spent in counseling,management, and coordination of care of her menorrhagia with  irregular cycle.   Thomasene MohairStephen Amiree No, MD, Merlinda FrederickFACOG [facility], [facility] 01/17/2019 5:43 PM    CC: Cheryle HorsfallPoulose, Elizabeth E, NP [address] [place],  [place] [id number]

## 2019-02-04 ENCOUNTER — Telehealth: Payer: Self-pay

## 2019-02-04 NOTE — Telephone Encounter (Signed)
Pt is having issues and requested for SDJ's nurse to call back.

## 2019-02-06 NOTE — Telephone Encounter (Signed)
Pt calling.  Wants a call back.  [phone]

## 2019-02-06 NOTE — Telephone Encounter (Signed)
Pt staes 5-6 days after taking birth control, bleeding started again. Pt started having some pain and discomfort now. Bleeding is also getting a lot heavier now. Wonders if she should still take the birth control or if SDJ wants to talk to/see her sooner. I advised to still continue birth control so it can get in her system, and told her I would send to SDJ to see what else he thinks. Pt aware I am out of office tomorrow d/t daycare issues, but I would call her back as soon as I hear something from [facility]. I also advised pt that if the bleeding gets really heavy and soaking through a pad/tampon an hour or if the pain get unbearable then to go to ER. Please advise

## 2019-02-11 NOTE — Telephone Encounter (Signed)
Left generic VM. Will call patient back.

## 2019-02-25 NOTE — Telephone Encounter (Signed)
Left generic VM 

## 2019-03-04 DIAGNOSIS — K036 Deposits [accretions] on teeth: Secondary | ICD-10-CM | POA: Diagnosis not present

## 2019-03-04 DIAGNOSIS — K05322 Chronic periodontitis, generalized, moderate: Secondary | ICD-10-CM | POA: Diagnosis not present

## 2019-03-04 DIAGNOSIS — Z1281 Encounter for screening for malignant neoplasm of oral cavity: Secondary | ICD-10-CM | POA: Diagnosis not present

## 2019-03-06 NOTE — Telephone Encounter (Signed)
Would you mind calling this patient and checking on her? I've had a hard time getting in touch with her.  Thanks!

## 2019-03-07 NOTE — Telephone Encounter (Signed)
Please let me know when pt calls back. We cannot get in touch with her

## 2019-03-12 NOTE — Telephone Encounter (Signed)
Please let us know when pt calls back. Completing message as we have not be able to get in touch with her and she has not returned our call yet

## 2019-06-02 ENCOUNTER — Other Ambulatory Visit: Payer: Self-pay | Admitting: Obstetrics and Gynecology

## 2019-06-02 DIAGNOSIS — Z1231 Encounter for screening mammogram for malignant neoplasm of breast: Secondary | ICD-10-CM

## 2019-06-05 ENCOUNTER — Ambulatory Visit
Admission: RE | Admit: 2019-06-05 | Discharge: 2019-06-05 | Disposition: A | Discharge status: Home / Self Care | Payer: BC Managed Care – PPO | Source: Ambulatory Visit | Attending: Obstetrics and Gynecology | Admitting: Obstetrics and Gynecology

## 2019-06-05 DIAGNOSIS — Z1231 Encounter for screening mammogram for malignant neoplasm of breast: Secondary | ICD-10-CM | POA: Insufficient documentation

## 2019-06-09 ENCOUNTER — Other Ambulatory Visit: Payer: Self-pay | Admitting: Obstetrics and Gynecology

## 2019-06-09 ENCOUNTER — Other Ambulatory Visit: Payer: Self-pay

## 2019-06-09 ENCOUNTER — Ambulatory Visit
Admission: EM | Admit: 2019-06-09 | Discharge: 2019-06-09 | Disposition: A | Discharge status: Home / Self Care | Payer: BC Managed Care – PPO | Attending: Emergency Medicine | Admitting: Emergency Medicine

## 2019-06-09 DIAGNOSIS — U071 COVID-19: Secondary | ICD-10-CM | POA: Diagnosis not present

## 2019-06-09 DIAGNOSIS — R928 Other abnormal and inconclusive findings on diagnostic imaging of breast: Secondary | ICD-10-CM

## 2019-06-09 DIAGNOSIS — N6320 Unspecified lump in the left breast, unspecified quadrant: Secondary | ICD-10-CM

## 2019-06-09 LAB — SARS CORONAVIRUS 2 AG (30 MIN TAT): SARS Coronavirus 2 Ag: POSITIVE — AB

## 2019-06-09 MED ORDER — ONDANSETRON 8 MG PO TBDP: ORAL_TABLET | ORAL | 0 refills | Status: DC

## 2019-06-09 MED ORDER — IBUPROFEN 600 MG PO TABS: 600.0000 mg | ORAL_TABLET | Freq: Four times a day (QID) | ORAL | 0 refills | Status: DC | PRN

## 2019-06-09 NOTE — ED Triage Notes (Signed)
Patient states that her husband is positive for Covid. Reports that she started having body aches, dizziness, loss of taste and smell, fatigue and shortness of breath since yesterday.

## 2019-06-09 NOTE — ED Provider Notes (Signed)
HPI  SUBJECTIVE:  Jenna Price is a 44 y.o. female who presents with decreased appetite, loss of sense of smell and taste, body aches, headaches, fatigue, shortness of breath, nausea,, decreased appetite, 5 episodes of watery nonbloody diarrhea starting yesterday.  Her husband is positive for Covid.  She denies fevers, nasal congestion, sore throat, cough, vomiting, abdominal pain.  She states that she feels dizzy described as lightheadedness when she gets up.  The dizziness is better with lying down. no ear pain, chest pain, syncope, vertigo.  No change in urine output.  She has been taking Tylenol and ibuprofen with some improvement in her symptoms.  Symptoms are worse with large positional changes.  No antipyretic in the past 4 to 6 hours.  Past medical history negative for pulmonary disease, smoking, diabetes, coronary artery disease, hypertension, chronic kidney disease, immunocompromise, cancer, vertigo, arrhythmia.  She is on phentermine for weight loss but has not taken any since yesterday morning.  LMP: Now.  Denies the possibility being pregnant.  PMD: [facility].Will Bonnet, MD   Past Medical History:  Diagnosis Date  . Morbid obesity ([place]) 08/03/2016  . MRSA (methicillin resistant staph aureus) culture positive   . Obesity     Past Surgical History:  Procedure Laterality Date  . NO PAST SURGERIES      Family History  Problem Relation Age of Onset  . Breast cancer Maternal Aunt        mat great aunt  . Drug abuse Mother   . Heart disease Mother     Social History   Tobacco Use  . Smoking status: Never Smoker  . Smokeless tobacco: Never Used  Substance Use Topics  . Alcohol use: No    Alcohol/week: 0.0 standard drinks  . Drug use: No    No current facility-administered medications for this encounter.  Current Outpatient Medications:  .  phentermine 37.5 MG capsule, Take 37.5 mg by mouth every morning., Disp: , Rfl:  .  ibuprofen (ADVIL) 600 MG tablet,  Take 1 tablet (600 mg total) by mouth every 6 (six) hours as needed., Disp: 30 tablet, Rfl: 0 .  ondansetron (ZOFRAN ODT) 8 MG disintegrating tablet, 1/2- 1 tablet q 8 hr prn nausea, vomiting, Disp: 20 tablet, Rfl: 0  No Known Allergies   ROS  As noted in HPI.   Physical Exam  BP 129/85 (BP Location: Left Arm)   Pulse (!) 122   Temp 99 F (37.2 C) (Oral)   Resp 18   Ht '5\' 4"'  (1.626 m)   Wt 98.9 kg   LMP 06/03/2019   SpO2 100%   BMI 37.42 kg/m   Constitutional: Well developed, well nourished, no acute distress Eyes: PERRL, EOMI, conjunctiva normal bilaterally HENT: Normocephalic, atraumatic,mucus membranes moist Respiratory: Clear to auscultation bilaterally, no rales, no wheezing, no rhonchi Cardiovascular: Regular tachycardia, no murmurs, no gallops, no rubs cap refill less than 2 seconds GI: Soft, nondistended, normal bowel sounds, nontender, no rebound, no guarding skin: No rash, skin intact Musculoskeletal: no deformities Neurologic: Alert & oriented x 3, CN III-XII grossly intact, no motor deficits, sensation grossly intact coordination normal  psychiatric: Speech and behavior appropriate   ED Course   Medications - No data to display  Orders Placed This Encounter  Procedures  . SARS Coronavirus 2 Ag (30 min TAT) - Nasal Swab (BD Veritor Kit)    Standing Status:   Standing    Number of Occurrences:   1    Order Specific Question:  Is this test for diagnosis or screening    Answer:   Diagnosis of ill patient    Order Specific Question:   Symptomatic for COVID-19 as defined by CDC    Answer:   Yes    Order Specific Question:   Date of Symptom Onset    Answer:   06/08/2019    Order Specific Question:   Hospitalized for COVID-19    Answer:   No    Order Specific Question:   Admitted to ICU for COVID-19    Answer:   No    Order Specific Question:   Previously tested for COVID-19    Answer:   No    Order Specific Question:   Resident in a congregate (group)  care setting    Answer:   No    Order Specific Question:   Employed in healthcare setting    Answer:   No    Order Specific Question:   Pregnant    Answer:   No   No results found for this or any previous visit (from the past 24 hour(s)). No results found.  ED Clinical Impression  1. COVID-19 virus infection      ED Assessment/Plan  Patient tachycardic, but not hypoxic.  Doubt PE.  Suspect Covid especially as her husband has it.  Suspect some dehydration from the diarrhea causing her tachycardia.  Abdomen benign.  Rapid Covid positive.  Home with Zofran, Imodium, ibuprofen 600 mg combined with 1 g of Tylenol 3-4 times a day as needed for body aches headaches.  Her lungs are clear she has no history of pulmonary disease.  Do not think that bronchodilators will be helpful.  Push fluids until urine is clear.  Follow-up with PMD as needed, to the ER if she gets worse.   Discussed labs,  MDM, treatment plan, and plan for follow-up with patient Discussed sn/sx that should prompt return to the ED. patient agrees with plan.   Meds ordered this encounter  Medications  . ondansetron (ZOFRAN ODT) 8 MG disintegrating tablet    Sig: 1/2- 1 tablet q 8 hr prn nausea, vomiting    Dispense:  20 tablet    Refill:  0  . ibuprofen (ADVIL) 600 MG tablet    Sig: Take 1 tablet (600 mg total) by mouth every 6 (six) hours as needed.    Dispense:  30 tablet    Refill:  0    *This clinic note was created using [employer]. Therefore, there may be occasional mistakes despite careful proofreading.  ?    Melynda Ripple, MD 06/10/19 1132

## 2019-06-09 NOTE — Discharge Instructions (Addendum)
Your Covid test is positive.  Continue pushing fluids.  Zofran for nausea and will also help slow down the diarrhea.  Imodium if needed.  Hope that you feel better soon.

## 2019-06-10 ENCOUNTER — Telehealth: Payer: Self-pay | Admitting: Nurse Practitioner

## 2019-06-10 NOTE — Telephone Encounter (Signed)
Called to Discuss with patient about Covid symptoms and the use of bamlanivimab, a monoclonal antibody infusion for those with mild to moderate Covid symptoms and at a high risk of hospitalization.     Pt is qualified for this infusion at the [facility] infusion center due to co-morbid conditions and/or a member of an at-risk group.     Unable to reach pt  

## 2019-06-20 ENCOUNTER — Ambulatory Visit
Admission: RE | Admit: 2019-06-20 | Discharge: 2019-06-20 | Disposition: A | Discharge status: Home / Self Care | Payer: BC Managed Care – PPO | Source: Ambulatory Visit | Attending: Obstetrics and Gynecology | Admitting: Obstetrics and Gynecology

## 2019-06-20 DIAGNOSIS — N6320 Unspecified lump in the left breast, unspecified quadrant: Secondary | ICD-10-CM

## 2019-06-20 DIAGNOSIS — N6323 Unspecified lump in the left breast, lower outer quadrant: Secondary | ICD-10-CM | POA: Diagnosis not present

## 2019-06-20 DIAGNOSIS — R928 Other abnormal and inconclusive findings on diagnostic imaging of breast: Secondary | ICD-10-CM

## 2019-06-20 DIAGNOSIS — R922 Inconclusive mammogram: Secondary | ICD-10-CM | POA: Diagnosis not present

## 2019-10-16 DIAGNOSIS — Z1281 Encounter for screening for malignant neoplasm of oral cavity: Secondary | ICD-10-CM | POA: Diagnosis not present

## 2019-10-16 DIAGNOSIS — K05322 Chronic periodontitis, generalized, moderate: Secondary | ICD-10-CM | POA: Diagnosis not present

## 2020-01-13 ENCOUNTER — Other Ambulatory Visit: Payer: Self-pay

## 2020-01-13 ENCOUNTER — Encounter: Payer: Self-pay | Admitting: Emergency Medicine

## 2020-01-13 ENCOUNTER — Ambulatory Visit
Admission: EM | Admit: 2020-01-13 | Discharge: 2020-01-13 | Disposition: A | Discharge status: Home / Self Care | Payer: BC Managed Care – PPO | Attending: Emergency Medicine | Admitting: Emergency Medicine

## 2020-01-13 DIAGNOSIS — N3001 Acute cystitis with hematuria: Secondary | ICD-10-CM | POA: Insufficient documentation

## 2020-01-13 LAB — URINALYSIS, COMPLETE (UACMP) WITH MICROSCOPIC
Bilirubin Urine: NEGATIVE
Glucose, UA: NEGATIVE mg/dL
Ketones, ur: NEGATIVE mg/dL
Nitrite: POSITIVE — AB
Protein, ur: 100 mg/dL — AB
Specific Gravity, Urine: >1.03 — ABNORMAL HIGH (ref 1.005–1.030)
WBC, UA: >50 WBC/hpf (ref 0–5)
pH: 6 (ref 5.0–8.0)

## 2020-01-13 MED ORDER — PHENAZOPYRIDINE HCL 200 MG PO TABS: 200.0000 mg | ORAL_TABLET | Freq: Three times a day (TID) | ORAL | 0 refills | Status: DC | PRN

## 2020-01-13 MED ORDER — NITROFURANTOIN MONOHYD MACRO 100 MG PO CAPS: 100.0000 mg | ORAL_CAPSULE | Freq: Two times a day (BID) | ORAL | 0 refills | Status: DC

## 2020-01-13 NOTE — ED Provider Notes (Signed)
HPI  SUBJECTIVE:  Jenna Price is a 44 y.o. female who presents with a "UTI" for the past 3 days.  She reports low midline abdominal pressure and discomfort with urination, urinary urgency, frequency.  No dysuria, cloudy or odorous urine,  unsure if there has been any hematuria, she is currently on menses.  No vaginal odor, discharge, genital rash or itching.  No back pain, no fevers, vomiting.  She is in a long-term monogamous relationship with her husband who is asymptomatic.  STDs are not a concern today.  No antibiotics recently.  No new perfumed soaps or body washes.  No antipyretic in the past 6 hours.  She tried heating pads with improvement in her pain.  Symptoms are worse with urination.  She has a past medical history of UTI, and states this feels identical to previous episodes.  No history of pyelonephritis, nephrolithiasis, diabetes, hypertension, gonorrhea, chlamydia, HIV, HSV, syphilis, trichomonas, BV, yeast.  LMP: Now.  WSF:KCLEXNT, Mila Homer, MD  Past Medical History:  Diagnosis Date  . Morbid obesity (HCC) 08/03/2016  . MRSA (methicillin resistant staph aureus) culture positive   . Obesity     Past Surgical History:  Procedure Laterality Date  . NO PAST SURGERIES      Family History  Problem Relation Age of Onset  . Breast cancer Maternal Aunt        mat great aunt  . Drug abuse Mother   . Heart disease Mother   . Other Father        unknown medical history    Social History   Tobacco Use  . Smoking status: Never Smoker  . Smokeless tobacco: Never Used  Vaping Use  . Vaping Use: Never used  Substance Use Topics  . Alcohol use: Yes    Alcohol/week: 0.0 standard drinks    Comment: rarely  . Drug use: No    No current facility-administered medications for this encounter.  Current Outpatient Medications:  .  phentermine 37.5 MG capsule, Take 37.5 mg by mouth every morning., Disp: , Rfl:  .  ibuprofen (ADVIL) 600 MG tablet, Take 1 tablet (600 mg total) by  mouth every 6 (six) hours as needed., Disp: 30 tablet, Rfl: 0 .  nitrofurantoin, macrocrystal-monohydrate, (MACROBID) 100 MG capsule, Take 1 capsule (100 mg total) by mouth 2 (two) times daily. X 5 days, Disp: 10 capsule, Rfl: 0 .  ondansetron (ZOFRAN ODT) 8 MG disintegrating tablet, 1/2- 1 tablet q 8 hr prn nausea, vomiting, Disp: 20 tablet, Rfl: 0 .  phenazopyridine (PYRIDIUM) 200 MG tablet, Take 1 tablet (200 mg total) by mouth 3 (three) times daily as needed for pain., Disp: 6 tablet, Rfl: 0  No Known Allergies   ROS  As noted in HPI.   Physical Exam  BP (!) 138/91 (BP Location: Left Arm)   Pulse 82   Temp 98.2 F (36.8 C) (Oral)   Resp 18   Ht 5\' 4"  (1.626 m)   Wt 77.1 kg   LMP 01/07/2020 (Exact Date)   SpO2 100%   BMI 29.18 kg/m   Constitutional: Well developed, well nourished, no acute distress Eyes:  EOMI, conjunctiva normal bilaterally HENT: Normocephalic, atraumatic,mucus membranes moist Respiratory: Normal inspiratory effort Cardiovascular: Normal rate GI: nondistended.  Positive suprapubic tenderness.  No flank tenderness. Back: No CVAT skin: No rash, skin intact Musculoskeletal: no deformities Neurologic: Alert & oriented x 3, no focal neuro deficits Psychiatric: Speech and behavior appropriate   ED Course   Medications -  No data to display  Orders Placed This Encounter  Procedures  . Urine culture    Standing Status:   Standing    Number of Occurrences:   1    Order Specific Question:   List patient's active antibiotics    Answer:   macrobid  . Urinalysis, Complete w Microscopic    Standing Status:   Standing    Number of Occurrences:   1    Results for orders placed or performed during the hospital encounter of 01/13/20 (from the past 24 hour(s))  Urinalysis, Complete w Microscopic Urine, Clean Catch     Status: Abnormal   Collection Time: 01/13/20 10:15 AM  Result Value Ref Range   Color, Urine YELLOW YELLOW   APPearance CLOUDY (A) CLEAR    Specific Gravity, Urine >1.030 (H) 1.005 - 1.030   pH 6.0 5.0 - 8.0   Glucose, UA NEGATIVE NEGATIVE mg/dL   Hgb urine dipstick MODERATE (A) NEGATIVE   Bilirubin Urine NEGATIVE NEGATIVE   Ketones, ur NEGATIVE NEGATIVE mg/dL   Protein, ur 284 (A) NEGATIVE mg/dL   Nitrite POSITIVE (A) NEGATIVE   Leukocytes,Ua SMALL (A) NEGATIVE   Squamous Epithelial / LPF 6-10 0 - 5   WBC, UA >50 0 - 5 WBC/hpf   RBC / HPF 0-5 0 - 5 RBC/hpf   Bacteria, UA MANY (A) NONE SEEN   No results found.  ED Clinical Impression  1. Acute cystitis with hematuria      ED Assessment/Plan  H&P consistent with a UTI.  UA positive nitrite, esterase many bacteria, moderate hematuria.  Will send this off for culture to confirm antibiotic choice.  Home with Pyridium, Macrobid.  Follow-up with PMD as needed.  To the ER if she gets worse.  Discussed labs,  MDM, treatment plan, and plan for follow-up with patient. Discussed sn/sx that should prompt return to the ED. patient agrees with plan.   Meds ordered this encounter  Medications  . nitrofurantoin, macrocrystal-monohydrate, (MACROBID) 100 MG capsule    Sig: Take 1 capsule (100 mg total) by mouth 2 (two) times daily. X 5 days    Dispense:  10 capsule    Refill:  0  . phenazopyridine (PYRIDIUM) 200 MG tablet    Sig: Take 1 tablet (200 mg total) by mouth 3 (three) times daily as needed for pain.    Dispense:  6 tablet    Refill:  0    *This clinic note was created using Scientist, clinical (histocompatibility and immunogenetics). Therefore, there may be occasional mistakes despite careful proofreading.   ?    Domenick Gong, MD 01/13/20 1510

## 2020-01-13 NOTE — Discharge Instructions (Addendum)
Your urinalysis was consistent with a urinary tract infection.  I have sent it off for culture make sure that you are on the correct antibiotic.  We will contact you and change your antibiotics if necessary.  Continue pushing fluids.  The Pyridium will help with your urinary symptoms.  Finish the [employer], even if you feel better.

## 2020-01-13 NOTE — ED Triage Notes (Signed)
Patient in today c/o urinary frequency and pressure x 2 days. Patient denies fever. Patient has not taken any OTC medications.

## 2020-01-15 LAB — URINE CULTURE: Culture: >=100000 — AB

## 2020-10-05 ENCOUNTER — Telehealth: Payer: Self-pay | Admitting: Family Medicine

## 2020-10-05 NOTE — Telephone Encounter (Signed)
Pt last seen Jenna Price 12/16/2018 and does not want to make virtual appt unless the md recommends. Pt test positive for covid yesterday at [facility]. Pt would like to know if provider will order paxlivid treatment for covid if so [facility] at [address] [address] phone [phone]

## 2020-10-07 NOTE — Telephone Encounter (Signed)
Tried to call pt and it seems as if her phone has bad service to get her an appt. Call kept dropping

## 2021-03-22 ENCOUNTER — Other Ambulatory Visit: Payer: Self-pay

## 2021-03-22 ENCOUNTER — Other Ambulatory Visit (HOSPITAL_COMMUNITY)
Admission: RE | Admit: 2021-03-22 | Discharge: 2021-03-22 | Disposition: A | Discharge status: Home / Self Care | Payer: BC Managed Care – PPO | Source: Ambulatory Visit | Attending: Obstetrics and Gynecology | Admitting: Obstetrics and Gynecology

## 2021-03-22 ENCOUNTER — Ambulatory Visit (INDEPENDENT_AMBULATORY_CARE_PROVIDER_SITE_OTHER): Payer: BC Managed Care – PPO | Admitting: Obstetrics and Gynecology

## 2021-03-22 ENCOUNTER — Encounter: Payer: Self-pay | Admitting: Obstetrics and Gynecology

## 2021-03-22 VITALS — BP 148/98 | Ht 63.0 in | Wt 235.0 lb

## 2021-03-22 DIAGNOSIS — Z1331 Encounter for screening for depression: Secondary | ICD-10-CM | POA: Diagnosis not present

## 2021-03-22 DIAGNOSIS — Z124 Encounter for screening for malignant neoplasm of cervix: Secondary | ICD-10-CM | POA: Diagnosis not present

## 2021-03-22 DIAGNOSIS — Z01419 Encounter for gynecological examination (general) (routine) without abnormal findings: Secondary | ICD-10-CM | POA: Diagnosis not present

## 2021-03-22 DIAGNOSIS — Z1339 Encounter for screening examination for other mental health and behavioral disorders: Secondary | ICD-10-CM

## 2021-03-22 DIAGNOSIS — Z1211 Encounter for screening for malignant neoplasm of colon: Secondary | ICD-10-CM

## 2021-03-22 DIAGNOSIS — Z1231 Encounter for screening mammogram for malignant neoplasm of breast: Secondary | ICD-10-CM

## 2021-03-22 MED ORDER — SUTAB 1479-225-188 MG PO TABS: 12.0000 | ORAL_TABLET | Freq: Once | ORAL | 0 refills | Status: AC

## 2021-03-22 NOTE — Progress Notes (Signed)
Gastroenterology Pre-Procedure Review  Request Date: 06/06/2021 Requesting Physician: Dr. Allegra Lai  PATIENT REVIEW QUESTIONS: The patient responded to the following health history questions as indicated:    1. Are you having any GI issues? no 2. Do you have a personal history of Polyps? no 3. Do you have a family history of Colon Cancer or Polyps? no 4. Diabetes Mellitus? no 5. Joint replacements in the past 12 months?no 6. Major health problems in the past 3 months?no 7. Any artificial heart valves, MVP, or defibrillator?no    MEDICATIONS & ALLERGIES:    Patient reports the following regarding taking any anticoagulation/antiplatelet therapy:   Plavix, Coumadin, Eliquis, Xarelto, Lovenox, Pradaxa, Brilinta, or Effient? no Aspirin? no  Patient confirms/reports the following medications:  Current Outpatient Medications  Medication Sig Dispense Refill   ibuprofen (ADVIL) 600 MG tablet Take 1 tablet (600 mg total) by mouth every 6 (six) hours as needed. 30 tablet 0   nitrofurantoin, macrocrystal-monohydrate, (MACROBID) 100 MG capsule Take 1 capsule (100 mg total) by mouth 2 (two) times daily. X 5 days 10 capsule 0   ondansetron (ZOFRAN ODT) 8 MG disintegrating tablet 1/2- 1 tablet q 8 hr prn nausea, vomiting 20 tablet 0   phenazopyridine (PYRIDIUM) 200 MG tablet Take 1 tablet (200 mg total) by mouth 3 (three) times daily as needed for pain. 6 tablet 0   phentermine 37.5 MG capsule Take 37.5 mg by mouth every morning.     No current facility-administered medications for this visit.    Patient confirms/reports the following allergies:  No Known Allergies  No orders of the defined types were placed in this encounter.   AUTHORIZATION INFORMATION Primary Insurance: 1D#: Group #:  Secondary Insurance: 1D#: Group #:  SCHEDULE INFORMATION: Date: 06/06/2021 Time: Location: [facility]

## 2021-03-22 NOTE — Progress Notes (Signed)
Gynecology Annual Exam  PCP: Conard Novak, MD  Chief Complaint  Patient presents with   Annual Exam   History of Present Illness:  [name] is a 45 y.o. who LMP was Patient's last menstrual period was 03/11/2021., presents today for her annual examination.  Her menses are regular every 28-30 days, lasting 5 day(s).  Dysmenorrhea none. She does not have intermenstrual bleeding.  She does not have vasomotor sx.   She is sexually active. She does not have vaginal dryness.  Last Pap: 07/2016  Results were: no abnormalities /neg HPV DNA negative Hx of STDs: none  Last mammogram: nearly 2 years ago  Results were: normal--routine follow-up in 12 months There is no FH of breast cancer. There is no FH of ovarian cancer. The patient does not do self-breast exams.  Colonoscopy: never had DEXA: has not been screened for osteoporosis  Tobacco use: The patient denies current or previous tobacco use. Alcohol use: social drinker Exercise: no  The patient wears seatbelts: yes.     Past Medical History:  Diagnosis Date   Morbid obesity (HCC) 08/03/2016   MRSA (methicillin resistant staph aureus) culture positive    Obesity     Past Surgical History:  Procedure Laterality Date   NO PAST SURGERIES      Prior to Admission medications   Medication Sig Start Date End Date Taking? Authorizing Provider  ibuprofen (ADVIL) 600 MG tablet Take 1 tablet (600 mg total) by mouth every 6 (six) hours as needed. 06/09/19   Domenick Gong, MD  nitrofurantoin, macrocrystal-monohydrate, (MACROBID) 100 MG capsule Take 1 capsule (100 mg total) by mouth 2 (two) times daily. X 5 days 01/13/20   Domenick Gong, MD  ondansetron (ZOFRAN ODT) 8 MG disintegrating tablet 1/2- 1 tablet q 8 hr prn nausea, vomiting 06/09/19   Domenick Gong, MD  phenazopyridine (PYRIDIUM) 200 MG tablet Take 1 tablet (200 mg total) by mouth 3 (three) times daily as needed for pain. 01/13/20   Domenick Gong, MD   phentermine 37.5 MG capsule Take 37.5 mg by mouth every morning.    [provider]  albuterol (PROVENTIL HFA;VENTOLIN HFA) 108 (90 Base) MCG/ACT inhaler Inhale 2 puffs into the lungs every 4 (four) hours as needed for wheezing. 04/29/18 06/09/19  Renford Dills, NP  medroxyPROGESTERone (PROVERA) 10 MG tablet Take 2 tablets (20 mg total) by mouth 3 (three) times daily for 7 days. 01/06/19 06/09/19  Conard Novak, MD    No Known Allergies  Obstetric History: No obstetric history on file.  Family History  Problem Relation Age of Onset   Breast cancer Maternal Aunt        mat great aunt   Drug abuse Mother    Heart disease Mother    Other Father        unknown medical history    Social History   Socioeconomic History   Marital status: Married    Spouse name: Not on file   Number of children: Not on file   Years of education: Not on file   Highest education level: Not on file  Occupational History   Not on file  Tobacco Use   Smoking status: Never   Smokeless tobacco: Never  Vaping Use   Vaping Use: Never used  Substance and Sexual Activity   Alcohol use: Yes    Alcohol/week: 0.0 standard drinks    Comment: rarely   Drug use: No   Sexual activity: Yes    Birth  control/protection: None  Other Topics Concern   Not on file  Social History Narrative   Not on file   Social Determinants of Health   Financial Resource Strain: Not on file  Food Insecurity: Not on file  Transportation Needs: Not on file  Physical Activity: Not on file  Stress: Not on file  Social Connections: Not on file  Intimate Partner Violence: Not on file    Review of Systems  Constitutional: Negative.   HENT: Negative.    Eyes: Negative.   Respiratory: Negative.    Cardiovascular: Negative.   Gastrointestinal: Negative.   Genitourinary: Negative.   Musculoskeletal: Negative.   Skin: Negative.   Neurological: Negative.   Psychiatric/Behavioral: Negative.      Physical  Exam BP (!) 148/98   Ht 5\' 3"  (1.6 m)   Wt 235 lb (106.6 kg)   LMP 03/11/2021   BMI 41.63 kg/m   Physical Exam Constitutional:      General: She is not in acute distress.    Appearance: Normal appearance. She is well-developed.  Genitourinary:     Vulva and bladder normal.     Right Labia: No rash, tenderness, lesions, skin changes or Bartholin's cyst.    Left Labia: No tenderness, lesions, skin changes, Bartholin's cyst or rash.    No inguinal adenopathy present in the right or left side.    Pelvic Tanner Score: 5/5.    No vaginal discharge, erythema, tenderness or bleeding.      Right Adnexa: not tender, not full and no mass present.    Left Adnexa: not tender, not full and no mass present.    No cervical motion tenderness, discharge, lesion or polyp.     Uterus is not enlarged or tender.     No uterine mass detected.    Pelvic exam was performed with patient in the lithotomy position.  Breasts:    Right: No inverted nipple, mass, nipple discharge, skin change or tenderness.     Left: No inverted nipple, mass, nipple discharge, skin change or tenderness.  HENT:     Head: Normocephalic and atraumatic.  Eyes:     General: No scleral icterus.    Conjunctiva/sclera: Conjunctivae normal.  Neck:     Thyroid: No thyromegaly.  Cardiovascular:     Rate and Rhythm: Normal rate and regular rhythm.     Heart sounds: No murmur heard.   No friction rub. No gallop.  Pulmonary:     Effort: Pulmonary effort is normal. No respiratory distress.     Breath sounds: Normal breath sounds. No wheezing or rales.  Abdominal:     General: Bowel sounds are normal. There is no distension.     Palpations: Abdomen is soft. There is no mass.     Tenderness: There is no abdominal tenderness. There is no guarding or rebound.     Hernia: There is no hernia in the left inguinal area or right inguinal area.  Musculoskeletal:        General: No swelling or tenderness. Normal range of motion.      Cervical back: Normal range of motion and neck supple.  Lymphadenopathy:     Cervical: No cervical adenopathy.     Lower Body: No right inguinal adenopathy. No left inguinal adenopathy.  Neurological:     General: No focal deficit present.     Mental Status: She is alert and oriented to person, place, and time.     Cranial Nerves: No cranial nerve deficit.  Skin:  General: Skin is warm and dry.     Findings: No erythema or rash.  Psychiatric:        Mood and Affect: Mood normal.        Behavior: Behavior normal.        Judgment: Judgment normal.    Female chaperone present for pelvic and breast  portions of the physical exam  Results: AUDIT Questionnaire (screen for alcoholism): 1 PHQ-9: 0  Assessment: 45 y.o. No obstetric history on file. female here for routine gynecologic examination.  Plan: Problem List Items Addressed This Visit   None Visit Diagnoses     Women's annual routine gynecological examination    -  Primary   Relevant Orders   Ambulatory referral to Gastroenterology   MM 3D SCREEN BREAST BILATERAL   Cytology - PAP   Screening for depression       Screening for alcoholism       Pap smear for cervical cancer screening       Relevant Orders   Cytology - PAP   Screen for colon cancer       Relevant Orders   Ambulatory referral to Gastroenterology   Encounter for screening mammogram for malignant neoplasm of breast       Relevant Orders   MM 3D SCREEN BREAST BILATERAL       Screening: -- Blood pressure screen elevated: continued to monitor. -- Colonoscopy - due - will schedule -- Mammogram - due. Patient to call [facility] to arrange. She understands that it is her responsibility to arrange this. -- Weight screening: obese: discussed management options, including lifestyle, dietary, and exercise. -- Depression screening negative (PHQ-9) -- Nutrition: normal -- cholesterol screening: per PCP -- osteoporosis screening: not due -- tobacco screening:  not using -- alcohol screening: AUDIT questionnaire indicates low-risk usage. -- family history of breast cancer screening: done. not at high risk. -- no evidence of domestic violence or intimate partner violence. -- STD screening: gonorrhea/chlamydia NAAT not collected per patient request. -- pap smear collected per ASCCP guidelines -- flu vaccine  has not received. She usually receives. -- HPV vaccination series: not eligilbe  Thomasene Mohair, MD 03/22/2021 11:32 AM

## 2021-03-28 ENCOUNTER — Ambulatory Visit
Admission: EM | Admit: 2021-03-28 | Discharge: 2021-03-28 | Disposition: A | Discharge status: Home / Self Care | Payer: BC Managed Care – PPO | Attending: Internal Medicine | Admitting: Internal Medicine

## 2021-03-28 DIAGNOSIS — H1089 Other conjunctivitis: Secondary | ICD-10-CM

## 2021-03-28 MED ORDER — ERYTHROMYCIN 5 MG/GM OP OINT: TOPICAL_OINTMENT | Freq: Two times a day (BID) | OPHTHALMIC | 0 refills | Status: AC

## 2021-03-28 NOTE — Discharge Instructions (Signed)
Please use medications as prescribed If you have any worsening symptoms please return to urgent care to be reevaluated If you develop any blurry vision, double vision or worsening eye pain please return to urgent care.

## 2021-03-28 NOTE — ED Provider Notes (Signed)
[facility]    CSN: [id number] Arrival date & time: 03/28/21  1100      History   Chief Complaint Chief Complaint  Patient presents with   Eye Pain    left    HPI Jenna Price is a 45 y.o. female comes to the urgent care with left eye pain this morning.  Patient.  Foreign body in the left eye this morning.  Patient was inspecting an apartment when a piece of wood fell into her eye.  She was able to wash it off.  No blurry vision.  No double vision.  No light sensitivity. HPI  Past Medical History:  Diagnosis Date   Morbid obesity (HCC) 08/03/2016   MRSA (methicillin resistant staph aureus) culture positive    Obesity     Patient Active Problem List   Diagnosis Date Noted   Obesity (BMI 35.0-39.9 without comorbidity) 08/22/2016   Abnormal mammogram of left breast 04/14/2016    Past Surgical History:  Procedure Laterality Date   NO PAST SURGERIES      OB History   No obstetric history on file.      Home Medications    Prior to Admission medications   Medication Sig Start Date End Date Taking? Authorizing Provider  erythromycin ophthalmic ointment Place into the left eye in the morning and at bedtime for 5 days. Place a 1/2 inch ribbon of ointment into the lower eyelid. 03/28/21 04/02/21 Yes Remmy Riffe, Britta Mccreedy, MD  albuterol (PROVENTIL HFA;VENTOLIN HFA) 108 (90 Base) MCG/ACT inhaler Inhale 2 puffs into the lungs every 4 (four) hours as needed for wheezing. 04/29/18 06/09/19  Renford Dills, NP  medroxyPROGESTERone (PROVERA) 10 MG tablet Take 2 tablets (20 mg total) by mouth 3 (three) times daily for 7 days. 01/06/19 06/09/19  Conard Novak, MD    Family History Family History  Problem Relation Age of Onset   Breast cancer Maternal Aunt        mat great aunt   Drug abuse Mother    Heart disease Mother    Other Father        unknown medical history    Social History Social History   Tobacco Use   Smoking status: Never   Smokeless tobacco:  Never  Vaping Use   Vaping Use: Never used  Substance Use Topics   Alcohol use: Yes    Alcohol/week: 0.0 standard drinks    Comment: rarely   Drug use: No     Allergies   Patient has no known allergies.   Review of Systems Review of Systems  Eyes:  Positive for pain and redness. Negative for photophobia, discharge, itching and visual disturbance.    Physical Exam Triage Vital Signs ED Triage Vitals  Enc Vitals Group     BP 03/28/21 1329 (!) 158/98     Pulse Rate 03/28/21 1329 88     Resp 03/28/21 1329 18     Temp 03/28/21 1329 98.7 F (37.1 C)     Temp Source 03/28/21 1329 Oral     SpO2 03/28/21 1329 100 %     Weight 03/28/21 1328 230 lb (104.3 kg)     Height 03/28/21 1328 5\' 3"  (1.6 m)     Head Circumference --      Peak Flow --      Pain Score 03/28/21 1327 0     Pain Loc --      Pain Edu? --      Excl. in GC? --  No data found.  Updated Vital Signs BP (!) 158/98 (BP Location: Left Arm)   Pulse 88   Temp 98.7 F (37.1 C) (Oral)   Resp 18   Ht 5\' 3"  (1.6 m)   Wt 104.3 kg   LMP 03/11/2021   SpO2 100%   BMI 40.74 kg/m   Visual Acuity Right Eye Distance: 20/35 uncorrected Left Eye Distance: 20/35 uncorrected Bilateral Distance: 20/35 uncorrected  Right Eye Near:   Left Eye Near:    Bilateral Near:     Physical Exam Vitals and nursing note reviewed.  Constitutional:      General: She is not in acute distress.    Appearance: She is not ill-appearing.  Eyes:     Extraocular Movements: Extraocular movements intact.     Pupils: Pupils are equal, round, and reactive to light.     Comments: Conjunctival erythema in the left eye.  Cardiovascular:     Rate and Rhythm: Normal rate and regular rhythm.  Musculoskeletal:     Cervical back: Normal range of motion and neck supple.  Neurological:     Mental Status: She is alert.     UC Treatments / Results  Labs (all labs ordered are listed, but only abnormal results are displayed) Labs Reviewed -  No data to display  EKG   Radiology No results found.  Procedures Procedures (including critical care time)  Medications Ordered in UC Medications - No data to display  Initial Impression / Assessment and Plan / UC Course  I have reviewed the triage vital signs and the nursing notes.  Pertinent labs & imaging results that were available during my care of the patient were reviewed by me and considered in my medical decision making (see chart for details).     1.  Acute conjunctivitis: Fluorescein stain is negative Erythromycin ointment twice daily for 5 days If symptoms worsen please return to urgent care to be reevaluated. Final Clinical Impressions(s) / UC Diagnoses   Final diagnoses:  Other conjunctivitis of left eye     Discharge Instructions      Please use medications as prescribed If you have any worsening symptoms please return to urgent care to be reevaluated If you develop any blurry vision, double vision or worsening eye pain please return to urgent care.   ED Prescriptions     Medication Sig Dispense Auth. Provider   erythromycin ophthalmic ointment Place into the left eye in the morning and at bedtime for 5 days. Place a 1/2 inch ribbon of ointment into the lower eyelid. 3.5 g Lavell Supple, Myrene Galas, MD      PDMP not reviewed this encounter.   Chase Picket, MD 03/28/21 [phone]

## 2021-03-28 NOTE — ED Triage Notes (Signed)
Pt here with C/O left eye redness, states she was looking up at work and something fell in eye.

## 2021-03-29 LAB — CYTOLOGY - PAP
Comment: NEGATIVE
Diagnosis: NEGATIVE
High risk HPV: NEGATIVE

## 2021-04-14 ENCOUNTER — Ambulatory Visit
Admission: RE | Admit: 2021-04-14 | Discharge: 2021-04-14 | Disposition: A | Discharge status: Home / Self Care | Payer: BC Managed Care – PPO | Source: Ambulatory Visit | Attending: Obstetrics and Gynecology | Admitting: Obstetrics and Gynecology

## 2021-04-14 ENCOUNTER — Other Ambulatory Visit: Payer: Self-pay

## 2021-04-14 DIAGNOSIS — Z1231 Encounter for screening mammogram for malignant neoplasm of breast: Secondary | ICD-10-CM | POA: Diagnosis present

## 2021-04-14 DIAGNOSIS — Z01419 Encounter for gynecological examination (general) (routine) without abnormal findings: Secondary | ICD-10-CM | POA: Diagnosis present

## 2021-06-06 ENCOUNTER — Encounter: Admission: RE | Payer: Self-pay | Source: Home / Self Care

## 2021-06-06 ENCOUNTER — Ambulatory Visit
Admission: RE | Admit: 2021-06-06 | Payer: BC Managed Care – PPO | Source: Home / Self Care | Admitting: Gastroenterology

## 2021-06-06 SURGERY — COLONOSCOPY WITH PROPOFOL
Anesthesia: General

## 2021-08-26 IMAGING — MG MM DIGITAL DIAGNOSTIC UNILAT*L* W/ TOMO W/ CAD
6 series · 6 of 14 positions shown · non-contrast
Comparison: 06/05/2019 and earlier

CLINICAL DATA: Patient returns after screening study for evaluation
of a possible LEFT breast mass. Patient reports having a cyst along
the LOWER portion of the LEFT breast which she has had for years.
Mass was previously evaluated by her physician.

EXAM:
DIGITAL DIAGNOSTIC LEFT MAMMOGRAM WITH CAD AND TOMO
ULTRASOUND LEFT BREAST

[L LM synth-2D]
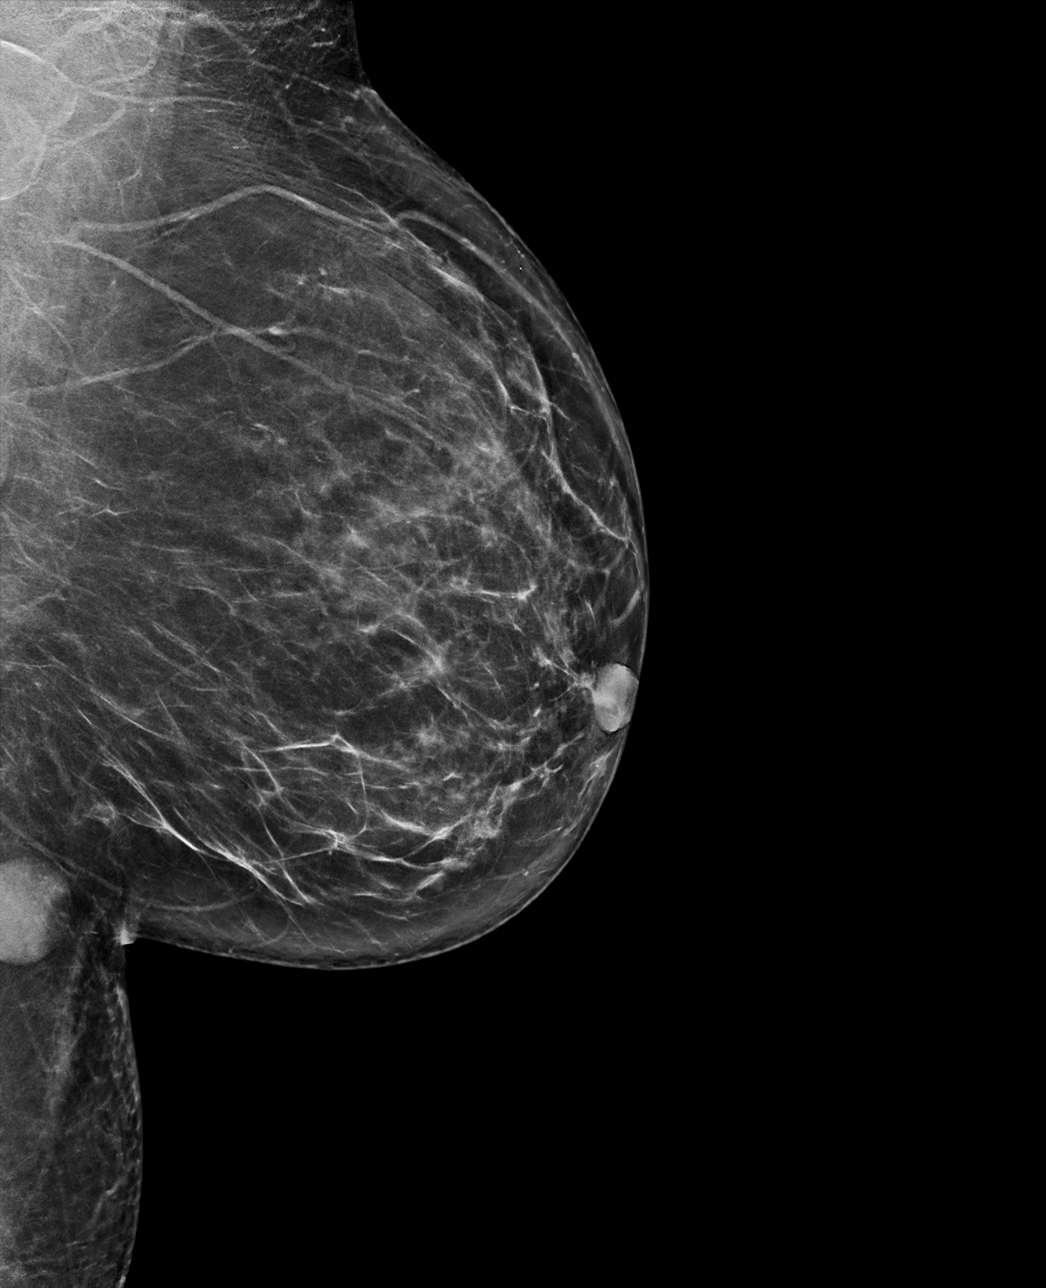

[L LMO synth-2D]
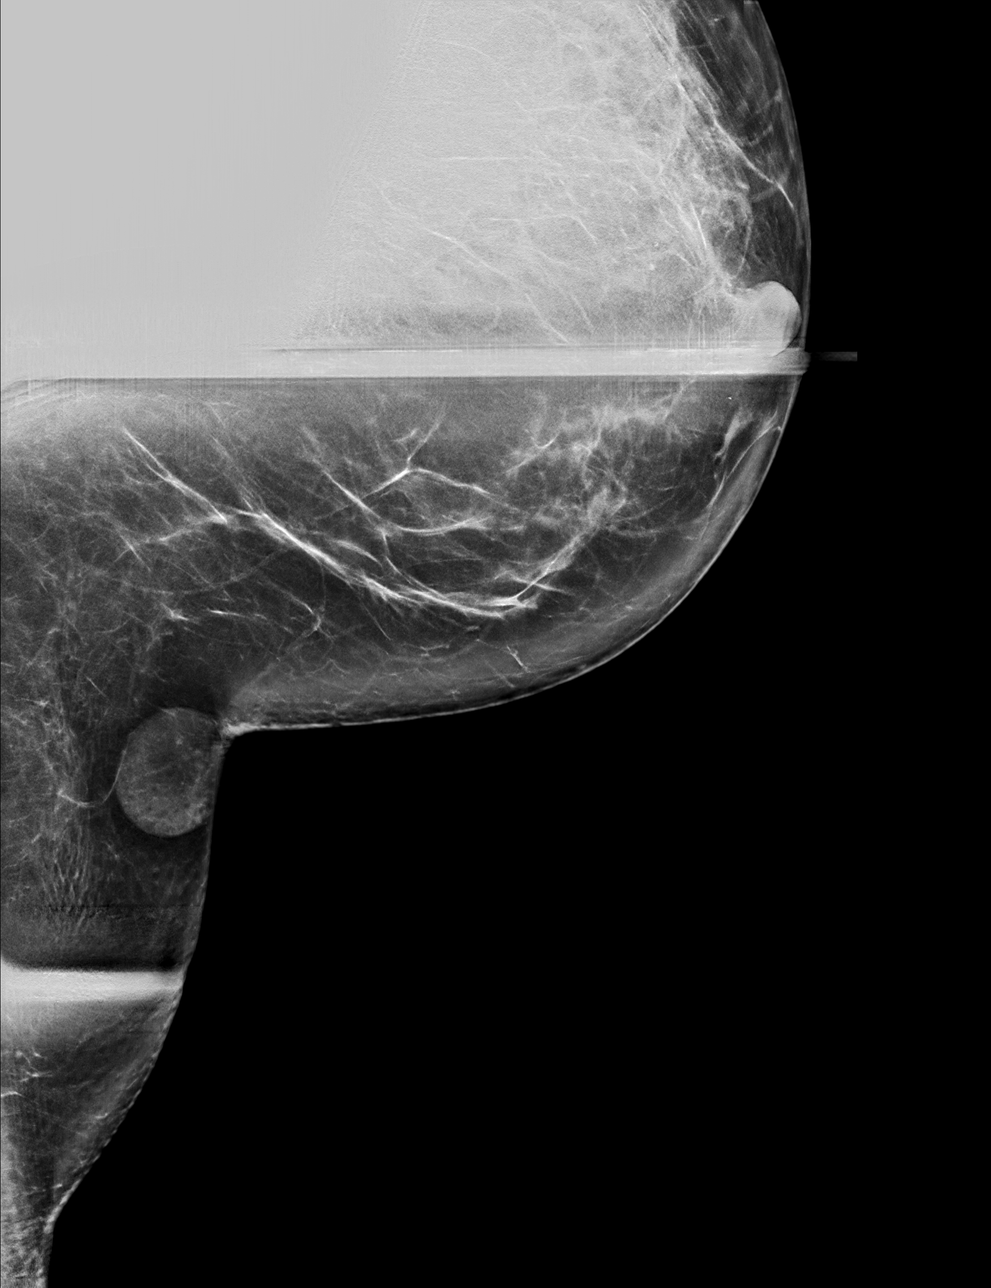

[L LM tomo (1 of 2)]
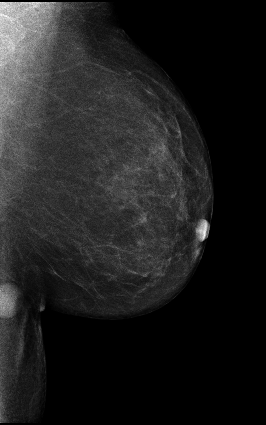

[L LMO tomo]
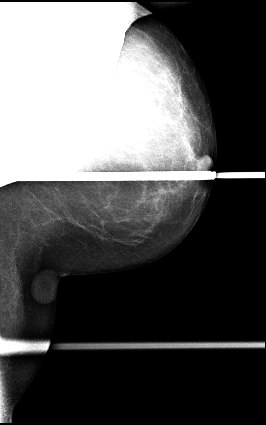

[L LMO BREAST TOMOSYNTHESIS IMAGE tomo · tomo slice 38/75.0]
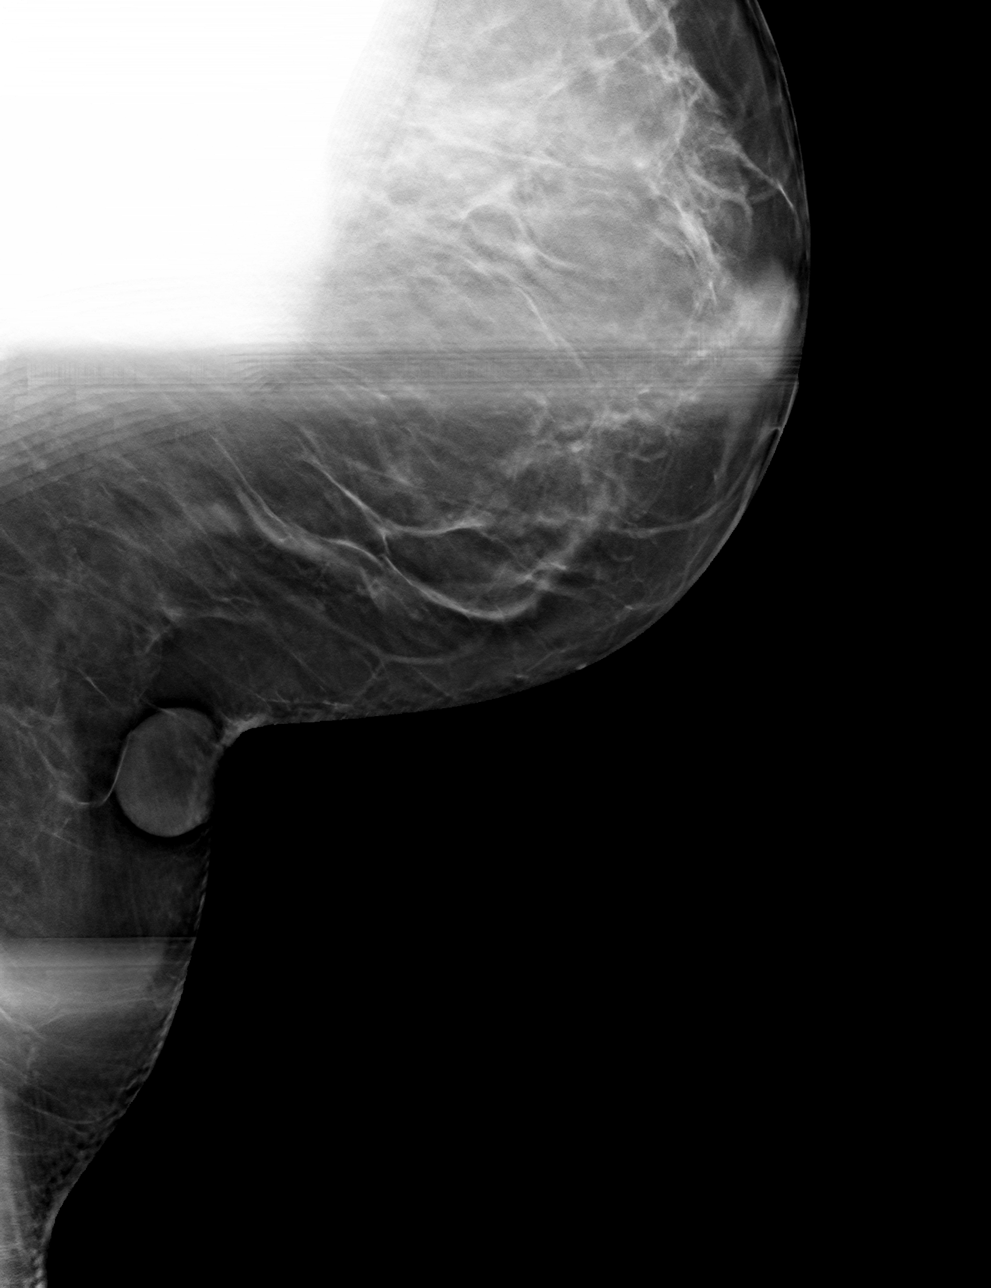

[L LM tomo (2 of 2) · tomo slice 39/76.0]
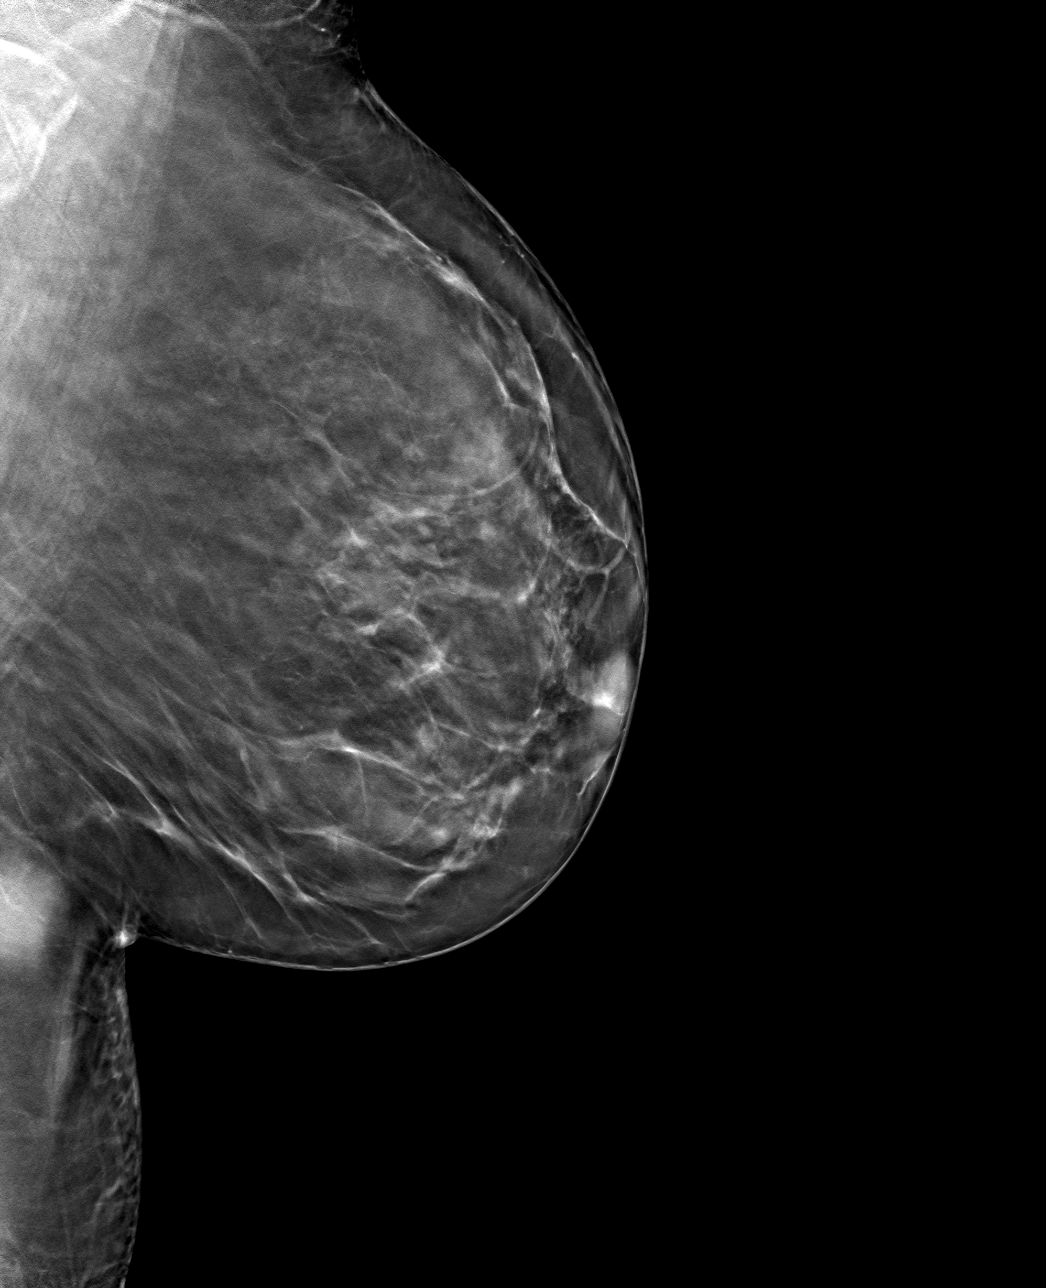

[6 of 14 positions shown; findings below may reference images not displayed]

ACR Breast Density Category c: The breast tissue is heterogeneously
dense, which may obscure small masses.
FINDINGS: Additional 2-D and 3-D images are performed. These views confirm
presence of a circumscribed 2.2 centimeter oval mass in the
inframammary fold region.

Mammographic images were processed with CAD.

On physical exam, I palpate a discrete superficial mass in the
LATERAL inframammary fold region of the LEFT breast. There is no
associated erythema or skin thickening. With gentle pressure, there
is a visible punctum superficial to the mass.

Targeted ultrasound is performed, showing a circumscribed oval mixed
echogenicity mass in the 4 o'clock inframammary fold region of the
LEFT breast. Mass measures 2.2 x 1.2 x 2.0 centimeters. Sinus tract
is visible along the surface of the mass. There is no associated
internal blood flow.
IMPRESSION: Persistent abnormality is a benign inclusion cyst. No mammographic
or ultrasound evidence for malignancy.

RECOMMENDATION:
Screening mammogram in one year.(Code:RS-R-M04)

I have discussed the findings and recommendations with the patient.
If applicable, a reminder letter will be sent to the patient
regarding the next appointment.

BI-RADS CATEGORY  2: Benign.

## 2021-11-16 ENCOUNTER — Ambulatory Visit
Admission: EM | Admit: 2021-11-16 | Discharge: 2021-11-16 | Disposition: A | Discharge status: Home / Self Care | Payer: BC Managed Care – PPO

## 2021-11-16 DIAGNOSIS — R21 Rash and other nonspecific skin eruption: Secondary | ICD-10-CM

## 2021-11-16 MED ORDER — METHYLPREDNISOLONE 4 MG PO TBPK: ORAL_TABLET | ORAL | 0 refills | Status: DC

## 2021-11-16 NOTE — ED Triage Notes (Signed)
Pt here with C/O rash started on stomach now on legs and arms. Became very itchy and red today. Goes to gym then tanning bed, also went to pool. Not sure where it came from.

## 2021-11-16 NOTE — Discharge Instructions (Signed)
Take over-the-counter Allegra 180 mg daily or Zyrtec or Claritin 10 mg daily to help with your itching.  You can take over-the-counter Benadryl, 50 mg at bedtime, as needed for itching and sleep.  Take the prednisone pack according to the package instructions.  You will taken on tapering dose over a period of 6 days.  Take it with food and always take it first in the morning with breakfast.  Take over-the-counter Pepcid 20 mg twice daily to help with itching as well.  If you develop any swelling of your lips or tongue, tightness in your throat, or difficulty breathing you need to go to the ER for evaluation.  

## 2021-11-16 NOTE — ED Provider Notes (Signed)
[facility]    CSN: [id number] Arrival date & time: 11/16/21  1629      History   Chief Complaint Chief Complaint  Patient presents with   Rash    HPI Jenna Price is a 46 y.o. female.   HPI  46 year old female here for evaluation of skin rash.  Patient reports that for the last week she has been experiencing skin rash that she reports started on her abdomen and then moved to both of her arms and this morning moved to both of her lower legs.  She states that the rash will itch when she gets hot or with exertion but does not itch all the time.  It is not associated with any fever, drainage, or sore throat.  She states that she has recently started go to the gym and she will exercise followed by using the tanning bed.  She states that in the past she would always use the tanning bed before she exercise but never had this issue.  She does mention that she recently started phentermine but she has been on that before without any rash development.  She also recently went to the pool with her kids.  She denies any swelling of her lips or tongue or shortness of breath.  The rash on her abdomen and arms has faded and improved.  Past Medical History:  Diagnosis Date   Morbid obesity ([place]) 08/03/2016   MRSA (methicillin resistant staph aureus) culture positive    Obesity     Patient Active Problem List   Diagnosis Date Noted   Obesity (BMI 35.0-39.9 without comorbidity) 08/22/2016   Abnormal mammogram of left breast 04/14/2016    Past Surgical History:  Procedure Laterality Date   NO PAST SURGERIES      OB History   No obstetric history on file.      Home Medications    Prior to Admission medications   Medication Sig Start Date End Date Taking? Authorizing Provider  methylPREDNISolone (MEDROL DOSEPAK) 4 MG TBPK tablet Take according to the package insert. 11/16/21  Yes Margarette Canada, NP  phentermine (ADIPEX-P) 37.5 MG tablet Take 37.5 mg by mouth daily before  breakfast.   Yes [provider]  albuterol (PROVENTIL HFA;VENTOLIN HFA) 108 (90 Base) MCG/ACT inhaler Inhale 2 puffs into the lungs every 4 (four) hours as needed for wheezing. 04/29/18 06/09/19  Marylene Land, NP  medroxyPROGESTERone (PROVERA) 10 MG tablet Take 2 tablets (20 mg total) by mouth 3 (three) times daily for 7 days. 01/06/19 06/09/19  Will Bonnet, MD    Family History Family History  Problem Relation Age of Onset   Breast cancer Maternal Aunt        mat great aunt   Drug abuse Mother    Heart disease Mother    Other Father        unknown medical history    Social History Social History   Tobacco Use   Smoking status: Never   Smokeless tobacco: Never  Vaping Use   Vaping Use: Never used  Substance Use Topics   Alcohol use: Yes    Alcohol/week: 0.0 standard drinks of alcohol    Comment: rarely   Drug use: No     Allergies   Patient has no known allergies.   Review of Systems Review of Systems  Constitutional:  Negative for fever.  HENT:  Negative for facial swelling and trouble swallowing.   Respiratory:  Negative for shortness of breath and wheezing.  Skin:  Positive for rash.  Hematological: Negative.   Psychiatric/Behavioral: Negative.       Physical Exam Triage Vital Signs ED Triage Vitals [11/16/21 1712]  Enc Vitals Group     BP      Pulse      Resp      Temp      Temp src      SpO2      Weight 230 lb (104.3 kg)     Height 5\' 4"  (1.626 m)     Head Circumference      Peak Flow      Pain Score 0     Pain Loc      Pain Edu?      Excl. in GC?    No data found.  Updated Vital Signs BP 132/90   Pulse 88   Temp 98.6 F (37 C) (Oral)   Resp 18   Ht 5\' 4"  (1.626 m)   Wt 230 lb (104.3 kg)   SpO2 99%   BMI 39.48 kg/m   Visual Acuity Right Eye Distance:   Left Eye Distance:   Bilateral Distance:    Right Eye Near:   Left Eye Near:    Bilateral Near:     Physical Exam Vitals and nursing note reviewed.   Constitutional:      Appearance: Normal appearance. She is not ill-appearing.  HENT:     Head: Normocephalic and atraumatic.  Skin:    General: Skin is warm and dry.     Capillary Refill: Capillary refill takes less than 2 seconds.     Findings: Erythema and rash present.  Neurological:     General: No focal deficit present.     Mental Status: She is alert and oriented to person, place, and time.  Psychiatric:        Mood and Affect: Mood normal.        Behavior: Behavior normal.        Thought Content: Thought content normal.        Judgment: Judgment normal.      UC Treatments / Results  Labs (all labs ordered are listed, but only abnormal results are displayed) Labs Reviewed - No data to display  EKG   Radiology No results found.  Procedures Procedures (including critical care time)  Medications Ordered in UC Medications - No data to display  Initial Impression / Assessment and Plan / UC Course  I have reviewed the triage vital signs and the nursing notes.  Pertinent labs & imaging results that were available during my care of the patient were reviewed by me and considered in my medical decision making (see chart for details).  Patient is a very pleasant, nontoxic-appearing 46 year old female here for evaluation of skin rash that has been present for the past week.  She states that the rash itches when she gets hot or exerts self but not regularly.  It did start on her abdomen, moved arms, and is now and on her legs.  The rash on her abdomen and arms is largely faded but there are scattered macular erythematous spots on her abdomen and arms.  The rash on her legs is maculopapular and erythematous.  It does not have a sandpapery texture.  When viewed under magnification the rash does not occur around hair follicles.  Patient has no swelling of her face or exhibiting any dyspnea or tachypnea.  Patient is wondering if she may be reacting to the sun or heat.  She has never  had any sun sensitivity or heat rash in the past.  I am suspicious that the patient is having a hypersensitivity reaction.  A known side effect of phentermine is skin rash and this may be exacerbated by her use of the tanning bed.  I cannot find any documentation of photosensitivity from this medication however.  I will treat her with a low-dose Medrol Dosepak and over-the-counter antihistamines.  I have advised her to avoid using the tanning bed or prolonged sun exposure while she is on the steroids as these will make her more photosensitive and it might exacerbate her rash.  Return precautions reviewed.   Final Clinical Impressions(s) / UC Diagnoses   Final diagnoses:  Rash and nonspecific skin eruption     Discharge Instructions      Take over-the-counter Allegra 180 mg daily or Zyrtec or Claritin 10 mg daily to help with your itching.  You can take over-the-counter Benadryl, 50 mg at bedtime, as needed for itching and sleep.  Take the prednisone pack according to the package instructions.  You will taken on tapering dose over a period of 6 days.  Take it with food and always take it first in the morning with breakfast.  Take over-the-counter Pepcid 20 mg twice daily to help with itching as well.  If you develop any swelling of your lips or tongue, tightness in your throat, or difficulty breathing you need to go to the ER for evaluation.      ED Prescriptions     Medication Sig Dispense Auth. Provider   methylPREDNISolone (MEDROL DOSEPAK) 4 MG TBPK tablet Take according to the package insert. 1 each Becky Augusta, NP      PDMP not reviewed this encounter.   Becky Augusta, NP 11/16/21 1739

## 2022-03-14 ENCOUNTER — Ambulatory Visit (INDEPENDENT_AMBULATORY_CARE_PROVIDER_SITE_OTHER): Payer: BC Managed Care – PPO | Admitting: Family Medicine

## 2022-03-14 ENCOUNTER — Encounter: Payer: Self-pay | Admitting: Family Medicine

## 2022-03-14 ENCOUNTER — Other Ambulatory Visit: Payer: Self-pay | Admitting: *Deleted

## 2022-03-14 ENCOUNTER — Telehealth: Payer: Self-pay | Admitting: *Deleted

## 2022-03-14 VITALS — BP 138/78 | HR 98 | Ht 64.0 in | Wt 197.0 lb

## 2022-03-14 DIAGNOSIS — Z0000 Encounter for general adult medical examination without abnormal findings: Secondary | ICD-10-CM | POA: Insufficient documentation

## 2022-03-14 DIAGNOSIS — R030 Elevated blood-pressure reading, without diagnosis of hypertension: Secondary | ICD-10-CM | POA: Diagnosis not present

## 2022-03-14 DIAGNOSIS — Z1211 Encounter for screening for malignant neoplasm of colon: Secondary | ICD-10-CM

## 2022-03-14 DIAGNOSIS — I10 Essential (primary) hypertension: Secondary | ICD-10-CM | POA: Insufficient documentation

## 2022-03-14 DIAGNOSIS — Z7689 Persons encountering health services in other specified circumstances: Secondary | ICD-10-CM | POA: Diagnosis not present

## 2022-03-14 HISTORY — DX: Encounter for general adult medical examination without abnormal findings: Z00.00

## 2022-03-14 MED ORDER — WEGOVY 1.7 MG/0.75ML ~~LOC~~ SOAJ: 1.7000 mg | SUBCUTANEOUS | 0 refills | Status: DC

## 2022-03-14 MED ORDER — NA SULFATE-K SULFATE-MG SULF 17.5-3.13-1.6 GM/177ML PO SOLN: 1.0000 | Freq: Once | ORAL | 0 refills | Status: AC

## 2022-03-14 NOTE — Assessment & Plan Note (Signed)
Due for the same, referral for screening colonoscopy placed.

## 2022-03-14 NOTE — Progress Notes (Signed)
     Primary Care / Sports Medicine Office Visit  Patient Information:  Patient ID: Jenna Price, female DOB: Oct 27, 1975 Age: 46 y.o. MRN: [id number]   Jenna Price is a pleasant 46 y.o. female presenting with the following:  Chief Complaint  Patient presents with   Establish Care    Pt has been seeing weight lost clinic has been getting wegovy. Would like to continue     Vitals:   03/14/22 1428  BP: 138/78  Pulse: 98  SpO2: 99%   Vitals:   03/14/22 1428  Weight: 197 lb (89.4 kg)  Height: 5\' 4"  (1.626 m)   Body mass index is 33.81 kg/m.  No results found.   Independent interpretation of notes and tests performed by another provider:   None  Procedures performed:   None  Pertinent History, Exam, Impression, and Recommendations:   Problem List Items Addressed This Visit       Other   Encounter for weight management - Primary    Chronic condition, ongoing.  Patient has been working on weight loss through outside weight management group with treatments inclusive of aggressive lifestyle changes from a diet and exercise standpoint, phentermine, and over the past few months Wegovy.  She has noted steady weight loss, mild GI related disturbances with her current Wegovy 1 mg dose but otherwise doing well.  Cardiopulmonary findings today are benign, abdominal examination with normoactive bowel sounds, no hepatosplenomegaly, nontender throughout without rebound.  At this stage plan for further titration to 1.7 mg Wegovy weekly, new Rx provided today.  I did encourage patient to continue with her lifestyle changes and we will coordinate a weight check at her follow-up visit for annual physical in 1 month.  She is to contact us for any issues with stronger dose, can consider titrating back to 1 mg at that time if indicated.      Relevant Medications   Semaglutide-Weight Management (WEGOVY) 1.7 MG/0.75ML SOAJ   Colon cancer screening    Due for the same, referral for  screening colonoscopy placed.      Relevant Orders   Ambulatory referral to Gastroenterology   Elevated blood pressure reading in office without diagnosis of hypertension    Incidentally noted on today's visit, cardiopulmonary findings are reassuring, we will be tracking this at her follow-up in addition to ordering of risk stratification labs.        Orders & Medications Meds ordered this encounter  Medications   Semaglutide-Weight Management (WEGOVY) 1.7 MG/0.75ML SOAJ    Sig: Inject 1.7 mg into the skin once a week.    Dispense:  3 mL    Refill:  0   Orders Placed This Encounter  Procedures   Ambulatory referral to Gastroenterology     Return in about 4 weeks (around 04/11/2022) for CPE.     Montel Culver, MD   Primary Care Sports Medicine [place]

## 2022-03-14 NOTE — Assessment & Plan Note (Signed)
>>  ASSESSMENT AND PLAN FOR ELEVATED BLOOD PRESSURE READING IN OFFICE WITHOUT DIAGNOSIS OF HYPERTENSION WRITTEN ON 03/14/2022  3:13 PM BY Neveyah Garzon, Earley Abide, MD  Incidentally noted on today's visit, cardiopulmonary findings are reassuring, we will be tracking this at her follow-up in addition to ordering of risk stratification labs.

## 2022-03-14 NOTE — Assessment & Plan Note (Signed)
Chronic condition, ongoing.  Patient has been working on weight loss through outside weight management group with treatments inclusive of aggressive lifestyle changes from a diet and exercise standpoint, phentermine, and over the past few months Wegovy.  She has noted steady weight loss, mild GI related disturbances with her current Wegovy 1 mg dose but otherwise doing well.  Cardiopulmonary findings today are benign, abdominal examination with normoactive bowel sounds, no hepatosplenomegaly, nontender throughout without rebound.  At this stage plan for further titration to 1.7 mg Wegovy weekly, new Rx provided today.  I did encourage patient to continue with her lifestyle changes and we will coordinate a weight check at her follow-up visit for annual physical in 1 month.  She is to contact us for any issues with stronger dose, can consider titrating back to 1 mg at that time if indicated.

## 2022-03-14 NOTE — Assessment & Plan Note (Signed)
Incidentally noted on today's visit, cardiopulmonary findings are reassuring, we will be tracking this at her follow-up in addition to ordering of risk stratification labs.

## 2022-03-14 NOTE — Telephone Encounter (Signed)
Gastroenterology Pre-Procedure Review  Request Date: 04/10/2022 Requesting Physician: Dr. Allen Norris  PATIENT REVIEW QUESTIONS: The patient responded to the following health history questions as indicated:    1. Are you having any GI issues? no 2. Do you have a personal history of Polyps? no 3. Do you have a family history of Colon Cancer or Polyps? no 4. Diabetes Mellitus? no 5. Joint replacements in the past 12 months?no 6. Major health problems in the past 3 months?no 7. Any artificial heart valves, MVP, or defibrillator?no    MEDICATIONS & ALLERGIES:    Patient reports the following regarding taking any anticoagulation/antiplatelet therapy:   Plavix, Coumadin, Eliquis, Xarelto, Lovenox, Pradaxa, Brilinta, or Effient? no Aspirin? no  Patient confirms/reports the following medications:  Current Outpatient Medications  Medication Sig Dispense Refill   Semaglutide-Weight Management (WEGOVY) 1.7 MG/0.75ML SOAJ Inject 1.7 mg into the skin once a week. 3 mL 0   No current facility-administered medications for this visit.    Patient confirms/reports the following allergies:  No Known Allergies  No orders of the defined types were placed in this encounter.   AUTHORIZATION INFORMATION Primary Insurance: 1D#: Group #:  Secondary Insurance: 1D#: Group #:  SCHEDULE INFORMATION: Date: 04/10/2022 Time: Location: [place]

## 2022-03-14 NOTE — Patient Instructions (Signed)
-   Start new Wegovy 1.7 mg weekly - Continue healthy lifestyle changes as you have been doing - Review attached information - Referral coordinator will contact you to schedule screening colonoscopy - Return for annual physical in 1 month

## 2022-04-06 ENCOUNTER — Telehealth: Payer: Self-pay | Admitting: *Deleted

## 2022-04-06 NOTE — Telephone Encounter (Signed)
Received message from [facility]  THIS PATIENT SCHEDULED FOR 11-13 MOVING TO 06-02-22 [facility]   New instructions will be sent to patient.

## 2022-04-11 ENCOUNTER — Ambulatory Visit (INDEPENDENT_AMBULATORY_CARE_PROVIDER_SITE_OTHER): Payer: BC Managed Care – PPO | Admitting: Family Medicine

## 2022-04-11 ENCOUNTER — Encounter: Payer: Self-pay | Admitting: Family Medicine

## 2022-04-11 VITALS — BP 130/80 | HR 68 | Ht 64.0 in | Wt 194.2 lb

## 2022-04-11 DIAGNOSIS — Z1231 Encounter for screening mammogram for malignant neoplasm of breast: Secondary | ICD-10-CM | POA: Diagnosis not present

## 2022-04-11 DIAGNOSIS — B001 Herpesviral vesicular dermatitis: Secondary | ICD-10-CM | POA: Insufficient documentation

## 2022-04-11 DIAGNOSIS — Z1159 Encounter for screening for other viral diseases: Secondary | ICD-10-CM

## 2022-04-11 DIAGNOSIS — Z0000 Encounter for general adult medical examination without abnormal findings: Secondary | ICD-10-CM | POA: Diagnosis not present

## 2022-04-11 DIAGNOSIS — Z13220 Encounter for screening for lipoid disorders: Secondary | ICD-10-CM | POA: Diagnosis not present

## 2022-04-11 DIAGNOSIS — Z7689 Persons encountering health services in other specified circumstances: Secondary | ICD-10-CM

## 2022-04-11 DIAGNOSIS — R030 Elevated blood-pressure reading, without diagnosis of hypertension: Secondary | ICD-10-CM

## 2022-04-11 HISTORY — DX: Herpesviral vesicular dermatitis: B00.1

## 2022-04-11 MED ORDER — WEGOVY 2.4 MG/0.75ML ~~LOC~~ SOAJ: 2.4000 mg | SUBCUTANEOUS | 2 refills | Status: DC

## 2022-04-11 MED ORDER — VALACYCLOVIR HCL 1 G PO TABS: 1000.0000 mg | ORAL_TABLET | Freq: Two times a day (BID) | ORAL | 1 refills | Status: DC

## 2022-04-11 NOTE — Patient Instructions (Addendum)
-   Obtain fasting labs with orders provided (can have water or black coffee but otherwise no food or drink x 8 hours before labs) - Review information provided - Attend eye doctor annually, dentist every 6 months, work towards or maintain 30 minutes of moderate intensity physical activity at least 5 days per week, and consume a balanced diet - Return in 2 months - Contact us for any questions between now and then 

## 2022-04-11 NOTE — Assessment & Plan Note (Signed)
Again noted elevation, risk stratification labs ordered, follow-up pending.  If persistent elevation noted upon return, anticipate pharmacotherapy.  Have encouraged lifestyle modifications over the interim.

## 2022-04-11 NOTE — Assessment & Plan Note (Signed)
Have to refrain from 2 weeks of Wegovy 1.7 due to GI adverse effects.  Did discuss the nature of this medication, importance of portion control and type of food ingested, staying on top of bowel regimen.  She has requested further titration, new Rx sent to pharmacy.  She was advised to contact our office if needing to stay on current dosage strength versus decrease.  Otherwise, can contact for refills to last until her return.

## 2022-04-11 NOTE — Assessment & Plan Note (Signed)
Longstanding history of the same, is interested in medication management, Valtrex twice daily x1 with quantity sufficient for recurrent episodes.

## 2022-04-11 NOTE — Assessment & Plan Note (Signed)
Annual examination completed, risk stratification labs ordered, anticipatory guidance provided.  We will follow labs once resulted. 

## 2022-04-11 NOTE — Progress Notes (Signed)
Annual Physical Exam Visit  Patient Information:  Patient ID: Jenna Price, female DOB: 1975/07/05 Age: 46 y.o. MRN: [id number]   Subjective:   CC: Annual Physical Exam  HPI:  Jenna Price is here for their annual physical.  I reviewed the past medical history, family history, social history, surgical history, and allergies today and changes were made as necessary.  Please see the problem list section below for additional details.  Past Medical History: Past Medical History:  Diagnosis Date   Breast mass    Morbid obesity ([facility]) 08/03/2016   MRSA (methicillin resistant staph aureus) culture positive    Obesity    Past Surgical History: Past Surgical History:  Procedure Laterality Date   NO PAST SURGERIES     Family History: Family History  Problem Relation Age of Onset   Breast cancer Maternal Aunt        mat great aunt   Drug abuse Mother    Heart disease Mother    Other Father        unknown medical history   Allergies: No Known Allergies Health Maintenance: Health Maintenance  Topic Date Due   Hepatitis C Screening  Never done   COLONOSCOPY (Pts 45-74yrs Insurance coverage will need to be confirmed)  Never done   INFLUENZA VACCINE  08/27/2022 (Originally 12/27/2021)   COVID-19 Vaccine (1) 03/30/2026 (Originally 02/07/1975)   TETANUS/TDAP  10/11/2022   PAP SMEAR-Modifier  03/22/2024   HIV Screening  Completed   HPV VACCINES  Aged Out    HM Colonoscopy          Overdue - COLONOSCOPY (Pts 45-49yrs Insurance coverage will need to be confirmed) (Every 10 Years) Overdue - never done    No completion history exists for this topic.           Medications: Current Outpatient Medications on File Prior to Visit  Medication Sig Dispense Refill   [DISCONTINUED] albuterol (PROVENTIL HFA;VENTOLIN HFA) 108 (90 Base) MCG/ACT inhaler Inhale 2 puffs into the lungs every 4 (four) hours as needed for wheezing. 1 Inhaler 0   [DISCONTINUED] medroxyPROGESTERone  (PROVERA) 10 MG tablet Take 2 tablets (20 mg total) by mouth 3 (three) times daily for 7 days. 42 tablet 0   No current facility-administered medications on file prior to visit.    Review of Systems: No headache, visual changes, nausea, vomiting, diarrhea, constipation, dizziness, abdominal pain, skin rash, fevers, chills, night sweats, swollen lymph nodes, weight loss, chest pain, body aches, joint swelling, muscle aches, shortness of breath, mood changes, visual or auditory hallucinations reported.  Objective:   Vitals:   04/11/22 0837 04/11/22 0847  BP: (!) 152/74 130/80  Pulse: 68   SpO2: 97%    Vitals:   04/11/22 0837  Weight: 194 lb 3.2 oz (88.1 kg)  Height: 5\' 4"  (1.626 m)   Body mass index is 33.33 kg/m.  General: Well Developed, well nourished, and in no acute distress.  Neuro: Alert and oriented x3, extra-ocular muscles intact, sensation grossly intact. Cranial nerves II through XII are grossly intact, motor, sensory, and coordinative functions are intact. HEENT: Normocephalic, atraumatic, pupils equal round reactive to light, neck supple, no masses, no lymphadenopathy, thyroid nonpalpable. Oropharynx with labial lesion consistent with HSV-1, nasopharynx, external ear canals are unremarkable. Skin: Warm and dry, no rashes noted. Readily mobile soft tissue, well-circumscribed roughly 3x4 cm firm nodularity at left chest wall under inframammary fold Cardiac: Regular rate and rhythm, no murmurs rubs or gallops. No peripheral  edema. Pulses symmetric. Respiratory: Clear to auscultation bilaterally. Not using accessory muscles, speaking in full sentences.  Abdominal: Soft, nontender, nondistended, positive bowel sounds, no masses, no organomegaly. Musculoskeletal: Shoulder, elbow, wrist, hip, knee, ankle stable, and with full range of motion.  Female chaperone initials: KG present throughout the physical examination.  Impression and Recommendations:   The patient was  counselled, risk factors were discussed, and anticipatory guidance given.  Problem List Items Addressed This Visit       Digestive   Recurrent cold sores    Longstanding history of the same, is interested in medication management, Valtrex twice daily x1 with quantity sufficient for recurrent episodes.      Relevant Medications   valACYclovir (VALTREX) 1000 MG tablet     Other   Encounter for weight management    Have to refrain from 2 weeks of Wegovy 1.7 due to GI adverse effects.  Did discuss the nature of this medication, importance of portion control and type of food ingested, staying on top of bowel regimen.  She has requested further titration, new Rx sent to pharmacy.  She was advised to contact our office if needing to stay on current dosage strength versus decrease.  Otherwise, can contact for refills to last until her return.      Annual physical exam - Primary    Annual examination completed, risk stratification labs ordered, anticipatory guidance provided.  We will follow labs once resulted.      Relevant Orders   CBC   Comprehensive metabolic panel   Lipid panel   TSH   MM 3D SCREEN BREAST BILATERAL   Hepatitis C antibody   Elevated blood pressure reading in office without diagnosis of hypertension    Again noted elevation, risk stratification labs ordered, follow-up pending.  If persistent elevation noted upon return, anticipate pharmacotherapy.  Have encouraged lifestyle modifications over the interim.      Other Visit Diagnoses     Screening for lipoid disorders       Relevant Orders   Comprehensive metabolic panel   Lipid panel   Encounter for screening mammogram for malignant neoplasm of breast       Relevant Orders   MM 3D SCREEN BREAST BILATERAL   Need for hepatitis C screening test       Relevant Orders   Hepatitis C antibody        Orders & Medications Medications:  Meds ordered this encounter  Medications   WEGOVY 2.4 MG/0.75ML SOAJ    Sig:  Inject 2.4 mg into the skin once a week.    Dispense:  3 mL    Refill:  2   valACYclovir (VALTREX) 1000 MG tablet    Sig: Take 1 tablet (1,000 mg total) by mouth 2 (two) times daily for 1 day.    Dispense:  4 tablet    Refill:  1   Orders Placed This Encounter  Procedures   MM 3D SCREEN BREAST BILATERAL   CBC   Comprehensive metabolic panel   Lipid panel   TSH   Hepatitis C antibody     Return in about 2 months (around 06/11/2022).    Montel Culver, MD   Primary Care Sports Medicine [place]

## 2022-04-11 NOTE — Assessment & Plan Note (Signed)
>>  ASSESSMENT AND PLAN FOR ELEVATED BLOOD PRESSURE READING IN OFFICE WITHOUT DIAGNOSIS OF HYPERTENSION WRITTEN ON 04/11/2022 10:07 AM BY Montey Ebel, Ocie Bob, MD  Again noted elevation, risk stratification labs ordered, follow-up pending.  If persistent elevation noted upon return, anticipate pharmacotherapy.  Have encouraged lifestyle modifications over the interim.

## 2022-04-12 LAB — COMPREHENSIVE METABOLIC PANEL
ALT: 10 IU/L (ref 0–32)
AST: 13 IU/L (ref 0–40)
Albumin/Globulin Ratio: 1.9 (ref 1.2–2.2)
Albumin: 4.4 g/dL (ref 3.9–4.9)
Alkaline Phosphatase: 59 IU/L (ref 44–121)
BUN/Creatinine Ratio: 16 (ref 9–23)
BUN: 14 mg/dL (ref 6–24)
Bilirubin Total: 0.4 mg/dL (ref 0.0–1.2)
CO2: 20 mmol/L (ref 20–29)
Calcium: 9.4 mg/dL (ref 8.7–10.2)
Chloride: 104 mmol/L (ref 96–106)
Creatinine, Ser: 0.87 mg/dL (ref 0.57–1.00)
Globulin, Total: 2.3 g/dL (ref 1.5–4.5)
Glucose: 89 mg/dL (ref 70–99)
Potassium: 4.2 mmol/L (ref 3.5–5.2)
Sodium: 141 mmol/L (ref 134–144)
Total Protein: 6.7 g/dL (ref 6.0–8.5)
eGFR: 83 mL/min/{1.73_m2} (ref >59)

## 2022-04-12 LAB — LIPID PANEL
Chol/HDL Ratio: 3.6 ratio (ref 0.0–4.4)
Cholesterol, Total: 204 mg/dL — ABNORMAL HIGH (ref 100–199)
HDL: 56 mg/dL (ref >39)
LDL Chol Calc (NIH): 125 mg/dL — ABNORMAL HIGH (ref 0–99)
Triglycerides: 132 mg/dL (ref 0–149)
VLDL Cholesterol Cal: 23 mg/dL (ref 5–40)

## 2022-04-12 LAB — CBC
Hematocrit: 40.4 % (ref 34.0–46.6)
Hemoglobin: 13.6 g/dL (ref 11.1–15.9)
MCH: 29.2 pg (ref 26.6–33.0)
MCHC: 33.7 g/dL (ref 31.5–35.7)
MCV: 87 fL (ref 79–97)
Platelets: 319 10*3/uL (ref 150–450)
RBC: 4.66 x10E6/uL (ref 3.77–5.28)
RDW: 12.5 % (ref 11.7–15.4)
WBC: 5.6 10*3/uL (ref 3.4–10.8)

## 2022-04-12 LAB — HEPATITIS C ANTIBODY: Hep C Virus Ab: NONREACTIVE

## 2022-04-12 LAB — TSH: TSH: 1.93 u[IU]/mL (ref 0.450–4.500)

## 2022-04-25 ENCOUNTER — Ambulatory Visit
Admission: RE | Admit: 2022-04-25 | Discharge: 2022-04-25 | Disposition: A | Discharge status: Home / Self Care | Payer: BC Managed Care – PPO | Source: Ambulatory Visit | Attending: Family Medicine | Admitting: Family Medicine

## 2022-04-25 DIAGNOSIS — Z1231 Encounter for screening mammogram for malignant neoplasm of breast: Secondary | ICD-10-CM | POA: Insufficient documentation

## 2022-04-25 DIAGNOSIS — Z0000 Encounter for general adult medical examination without abnormal findings: Secondary | ICD-10-CM

## 2022-05-17 DIAGNOSIS — U071 COVID-19: Secondary | ICD-10-CM

## 2022-05-17 HISTORY — DX: COVID-19: U07.1

## 2022-05-24 ENCOUNTER — Encounter: Payer: Self-pay | Admitting: Gastroenterology

## 2022-05-30 ENCOUNTER — Encounter: Payer: Self-pay | Admitting: *Deleted

## 2022-06-01 MED ORDER — CLENPIQ 10-3.5-12 MG-GM -GM/160ML PO SOLN: 1.0000 | Freq: Once | ORAL | 0 refills | Status: DC

## 2022-06-01 MED ORDER — CLENPIQ 10-3.5-12 MG-GM -GM/175ML PO SOLN: 1.0000 | Freq: Once | ORAL | 0 refills | Status: AC

## 2022-06-01 NOTE — Addendum Note (Signed)
Addended by: Jacqualin Combes on: 06/01/2022 09:13 AM   Modules accepted: Orders

## 2022-06-01 NOTE — Addendum Note (Signed)
Addended by: Jacqualin Combes on: 06/01/2022 09:02 AM   Modules accepted: Orders

## 2022-06-02 ENCOUNTER — Ambulatory Visit: Payer: BC Managed Care – PPO | Admitting: Anesthesiology

## 2022-06-02 ENCOUNTER — Ambulatory Visit
Admission: RE | Admit: 2022-06-02 | Discharge: 2022-06-02 | Disposition: A | Discharge status: Home / Self Care | Payer: BC Managed Care – PPO | Attending: Gastroenterology | Admitting: Gastroenterology

## 2022-06-02 ENCOUNTER — Encounter
Admission: RE | Disposition: A | Discharge status: Home / Self Care | Payer: Self-pay | Source: Home / Self Care | Attending: Gastroenterology

## 2022-06-02 DIAGNOSIS — K5730 Diverticulosis of large intestine without perforation or abscess without bleeding: Secondary | ICD-10-CM | POA: Diagnosis not present

## 2022-06-02 DIAGNOSIS — Z8616 Personal history of COVID-19: Secondary | ICD-10-CM | POA: Diagnosis not present

## 2022-06-02 DIAGNOSIS — Z1211 Encounter for screening for malignant neoplasm of colon: Secondary | ICD-10-CM

## 2022-06-02 DIAGNOSIS — K640 First degree hemorrhoids: Secondary | ICD-10-CM | POA: Diagnosis not present

## 2022-06-02 DIAGNOSIS — Z6833 Body mass index (BMI) 33.0-33.9, adult: Secondary | ICD-10-CM | POA: Diagnosis not present

## 2022-06-02 HISTORY — PX: COLONOSCOPY WITH PROPOFOL: SHX5780

## 2022-06-02 LAB — POCT PREGNANCY, URINE: Preg Test, Ur: NEGATIVE

## 2022-06-02 SURGERY — COLONOSCOPY WITH PROPOFOL
Anesthesia: General | Site: Rectum

## 2022-06-02 MED ORDER — LIDOCAINE HCL (CARDIAC) PF 100 MG/5ML IV SOSY
PREFILLED_SYRINGE | INTRAVENOUS | Status: DC | PRN
  Administered 2022-06-02: 80 mg via INTRAVENOUS

## 2022-06-02 MED ORDER — LACTATED RINGERS IV SOLN: INTRAVENOUS | Status: DC

## 2022-06-02 MED ORDER — SODIUM CHLORIDE 0.9 % IV SOLN: INTRAVENOUS | Status: DC

## 2022-06-02 MED ORDER — KETOROLAC TROMETHAMINE 30 MG/ML IJ SOLN
INTRAMUSCULAR | Status: DC | PRN
  Administered 2022-06-02: 15 mg via INTRAVENOUS

## 2022-06-02 MED ORDER — STERILE WATER FOR IRRIGATION IR SOLN
Status: DC | PRN
  Administered 2022-06-02: 60 mL

## 2022-06-02 MED ORDER — STERILE WATER FOR IRRIGATION IR SOLN
Status: DC | PRN
  Administered 2022-06-02: 100 mL

## 2022-06-02 MED ORDER — FENTANYL CITRATE PF 50 MCG/ML IJ SOSY: 25.0000 ug | PREFILLED_SYRINGE | INTRAMUSCULAR | Status: DC | PRN

## 2022-06-02 MED ORDER — ONDANSETRON HCL 4 MG/2ML IJ SOLN: 4.0000 mg | Freq: Once | INTRAMUSCULAR | Status: DC | PRN

## 2022-06-02 MED ORDER — PROPOFOL 10 MG/ML IV BOLUS
INTRAVENOUS | Status: DC | PRN
  Administered 2022-06-02: 40 mg via INTRAVENOUS
  Administered 2022-06-02: 80 mg via INTRAVENOUS
  Administered 2022-06-02: 40 mg via INTRAVENOUS
  Administered 2022-06-02: 20 mg via INTRAVENOUS
  Administered 2022-06-02 (×2): 40 mg via INTRAVENOUS

## 2022-06-02 SURGICAL SUPPLY — 6 items
GOWN CVR UNV OPN BCK APRN NK (MISCELLANEOUS) ×2 IMPLANT
GOWN ISOL THUMB LOOP REG UNIV (MISCELLANEOUS) ×2
KIT PRC NS LF DISP ENDO (KITS) ×1 IMPLANT
KIT PROCEDURE OLYMPUS (KITS) ×1
MANIFOLD NEPTUNE II (INSTRUMENTS) ×1 IMPLANT
WATER STERILE IRR 250ML POUR (IV SOLUTION) ×1 IMPLANT

## 2022-06-02 NOTE — Op Note (Signed)
[facility] Gastroenterology Patient Name: Jenna Price Procedure Date: 06/02/2022 7:35 AM MRN: [id number] Account #: [id number] Date of Birth: 09-Apr-1976 Admit Type: Outpatient Age: 47 Room: [facility] OR ROOM 01 Gender: Female Note Status: Finalized Instrument Name: [id number] Procedure:             Colonoscopy Indications:           Screening for colorectal malignant neoplasm Providers:             Midge Minium MD, MD Medicines:             Propofol per Anesthesia Complications:         No immediate complications. Procedure:             Pre-Anesthesia Assessment:                        - Prior to the procedure, a History and Physical was                         performed, and patient medications and allergies were                         reviewed. The patient's tolerance of previous                         anesthesia was also reviewed. The risks and benefits                         of the procedure and the sedation options and risks                         were discussed with the patient. All questions were                         answered, and informed consent was obtained. Prior                         Anticoagulants: The patient has taken no anticoagulant                         or antiplatelet agents. ASA Grade Assessment: II - A                         patient with mild systemic disease. After reviewing                         the risks and benefits, the patient was deemed in                         satisfactory condition to undergo the procedure.                        After obtaining informed consent, the colonoscope was                         passed under direct vision. Throughout the procedure,                         the patient's blood pressure,  pulse, and oxygen                         saturations were monitored continuously. The                         Colonoscope was introduced through the anus and                         advanced to the the cecum,  identified by appendiceal                         orifice and ileocecal valve. The colonoscopy was                         performed without difficulty. The patient tolerated                         the procedure well. The quality of the bowel                         preparation was excellent. Findings:      The perianal and digital rectal examinations were normal.      Multiple small-mouthed diverticula were found in the entire colon.      Non-bleeding internal hemorrhoids were found during retroflexion. The       hemorrhoids were Grade I (internal hemorrhoids that do not prolapse). Impression:            - Diverticulosis in the entire examined colon.                        - Non-bleeding internal hemorrhoids.                        - No specimens collected. Recommendation:        - Discharge patient to home.                        - Resume previous diet.                        - Continue present medications.                        - Repeat colonoscopy in 10 years for screening                         purposes. Procedure Code(s):     --- Professional ---                        [phone], Colonoscopy, flexible; diagnostic, including                         collection of specimen(s) by brushing or washing, when                         performed (separate procedure) Diagnosis Code(s):     --- Professional ---                        Z12.11, Encounter for  screening for malignant neoplasm                         of colon CPT copyright 2022 American Medical Association. All rights reserved. The codes documented in this report are preliminary and upon coder review may  be revised to meet current compliance requirements. [name] MD, MD 06/02/2022 8:10:22 AM This report has been signed electronically. Number of Addenda: 0 Note Initiated On: 06/02/2022 7:35 AM Scope Withdrawal Time: 0 hours 8 minutes 23 seconds  Total Procedure Duration: 0 hours 10 minutes 17 seconds  Estimated Blood Loss:   Estimated blood loss: none.      [facility]

## 2022-06-02 NOTE — H&P (Signed)
Jenna Lame, MD [address] [place], [place] [address] Phone: [phone] Fax : [phone]  Primary Care Physician:  Montel Culver, MD Primary Gastroenterologist:  Dr. Allen Norris  Pre-Procedure History & Physical: HPI:  Jenna Price is a 47 y.o. female is here for a screening colonoscopy.   Past Medical History:  Diagnosis Date   Annual physical exam 03/14/2022   Breast mass    COVID-19 05/17/2022   Symptoms resolved 05/20/22   Morbid obesity ([place]) 08/03/2016   MRSA (methicillin resistant staph aureus) culture positive    Obesity     Past Surgical History:  Procedure Laterality Date   DILATION AND CURETTAGE OF UTERUS  2000    Prior to Admission medications   Medication Sig Start Date End Date Taking? Authorizing Provider  WEGOVY 2.4 MG/0.75ML SOAJ Inject 2.4 mg into the skin once a week. 04/11/22  Yes Montel Culver, MD  albuterol (PROVENTIL HFA;VENTOLIN HFA) 108 (90 Base) MCG/ACT inhaler Inhale 2 puffs into the lungs every 4 (four) hours as needed for wheezing. 04/29/18 06/09/19  Marylene Land, NP  medroxyPROGESTERone (PROVERA) 10 MG tablet Take 2 tablets (20 mg total) by mouth 3 (three) times daily for 7 days. 01/06/19 06/09/19  Will Bonnet, MD    Allergies as of 03/14/2022   (No Known Allergies)    Family History  Problem Relation Age of Onset   Breast cancer Maternal Aunt        mat great aunt   Drug abuse Mother    Heart disease Mother    Other Father        unknown medical history    Social History   Socioeconomic History   Marital status: Married    Spouse name: Not on file   Number of children: Not on file   Years of education: Not on file   Highest education level: Not on file  Occupational History   Not on file  Tobacco Use   Smoking status: Never   Smokeless tobacco: Never  Vaping Use   Vaping Use: Never used  Substance and Sexual Activity   Alcohol use: Not Currently    Comment: rarely   Drug use: No    Sexual activity: Yes    Birth control/protection: None  Other Topics Concern   Not on file  Social History Narrative   Not on file   Social Determinants of Health   Financial Resource Strain: Not on file  Food Insecurity: No Food Insecurity (03/14/2022)   Hunger Vital Sign    Worried About Running Out of Food in the Last Year: Never true    Ran Out of Food in the Last Year: Never true  Transportation Needs: No Transportation Needs (03/14/2022)   PRAPARE - Hydrologist (Medical): No    Lack of Transportation (Non-Medical): No  Physical Activity: Not on file  Stress: Not on file  Social Connections: Not on file  Intimate Partner Violence: Not At Risk (03/14/2022)   Humiliation, Afraid, Rape, and Kick questionnaire    Fear of Current or Ex-Partner: No    Emotionally Abused: No    Physically Abused: No    Sexually Abused: No    Review of Systems: See HPI, otherwise negative ROS  Physical Exam: BP (!) 140/91   Pulse (!) 111   Temp 97.8 F (36.6 C) (Temporal)   Resp 18   Ht 5\' 4"  (1.626 m)   Wt 87.5 kg   LMP  05/12/2022 (Exact Date)   SpO2 100%   BMI 33.13 kg/m  General:   Alert,  pleasant and cooperative in NAD Head:  Normocephalic and atraumatic. Neck:  Supple; no masses or thyromegaly. Lungs:  Clear throughout to auscultation.    Heart:  Regular rate and rhythm. Abdomen:  Soft, nontender and nondistended. Normal bowel sounds, without guarding, and without rebound.   Neurologic:  Alert and  oriented x4;  grossly normal neurologically.  Impression/Plan: Jenna Price is now here to undergo a screening colonoscopy.  Risks, benefits, and alternatives regarding colonoscopy have been reviewed with the patient.  Questions have been answered.  All parties agreeable.

## 2022-06-02 NOTE — Anesthesia Postprocedure Evaluation (Signed)
Anesthesia Post Note  Patient: Jenna Price  Procedure(s) Performed: COLONOSCOPY WITH PROPOFOL (Rectum)  Patient location during evaluation: PACU Anesthesia Type: General Level of consciousness: awake and alert Pain management: pain level controlled Vital Signs Assessment: post-procedure vital signs reviewed and stable Respiratory status: spontaneous breathing, nonlabored ventilation, respiratory function stable and patient connected to nasal cannula oxygen Cardiovascular status: blood pressure returned to baseline and stable Postop Assessment: no apparent nausea or vomiting Anesthetic complications: no   No notable events documented.   Last Vitals:  Vitals:   06/02/22 0812 06/02/22 0823  BP: 122/89 (!) 138/92  Pulse: 95 96  Resp: 18 19  Temp: 36.7 C 36.7 C  SpO2: 98% 97%    Last Pain:  Vitals:   06/02/22 0823  TempSrc:   PainSc: 0-No pain                 Molli Barrows

## 2022-06-02 NOTE — Anesthesia Preprocedure Evaluation (Signed)
Anesthesia Evaluation  Patient identified by MRN, date of birth, ID band Patient awake    Reviewed: Allergy & Precautions, H&P , NPO status , Patient's Chart, lab work & pertinent test results, reviewed documented beta blocker date and time   Airway Mallampati: II   Neck ROM: full    Dental  (+) Poor Dentition   Pulmonary neg pulmonary ROS   Pulmonary exam normal        Cardiovascular negative cardio ROS Normal cardiovascular exam Rhythm:regular Rate:Normal     Neuro/Psych negative neurological ROS  negative psych ROS   GI/Hepatic negative GI ROS, Neg liver ROS,,,  Endo/Other  negative endocrine ROS    Renal/GU negative Renal ROS  negative genitourinary   Musculoskeletal   Abdominal   Peds  Hematology negative hematology ROS (+)   Anesthesia Other Findings Past Medical History: 03/14/2022: Annual physical exam No date: Breast mass 05/17/2022: COVID-19     Comment:  Symptoms resolved 05/20/22 08/03/2016: Morbid obesity ([place]) No date: MRSA (methicillin resistant staph aureus) culture positive No date: Obesity Past Surgical History: 2000: DILATION AND CURETTAGE OF UTERUS BMI    Body Mass Index: 32.27 kg/m     Reproductive/Obstetrics negative OB ROS                             Anesthesia Physical Anesthesia Plan  ASA: 3  Anesthesia Plan: General   Post-op Pain Management:    Induction:   PONV Risk Score and Plan: 3  Airway Management Planned:   Additional Equipment:   Intra-op Plan:   Post-operative Plan:   Informed Consent: I have reviewed the patients History and Physical, chart, labs and discussed the procedure including the risks, benefits and alternatives for the proposed anesthesia with the patient or authorized representative who has indicated his/her understanding and acceptance.     Dental Advisory Given  Plan Discussed with: CRNA  Anesthesia Plan  Comments:        Anesthesia Quick Evaluation

## 2022-06-02 NOTE — Transfer of Care (Signed)
Immediate Anesthesia Transfer of Care Note  Patient: Jenna Price  Procedure(s) Performed: COLONOSCOPY WITH PROPOFOL (Rectum)  Patient Location: PACU  Anesthesia Type: General  Level of Consciousness: awake, alert  and patient cooperative  Airway and Oxygen Therapy: Patient Spontanous Breathing and Patient connected to supplemental oxygen  Post-op Assessment: Post-op Vital signs reviewed, Patient's Cardiovascular Status Stable, Respiratory Function Stable, Patent Airway and No signs of Nausea or vomiting  Post-op Vital Signs: Reviewed and stable  Complications: No notable events documented.

## 2022-06-05 ENCOUNTER — Encounter: Payer: Self-pay | Admitting: Gastroenterology

## 2022-06-08 ENCOUNTER — Encounter: Payer: Self-pay | Admitting: Family Medicine

## 2022-06-08 ENCOUNTER — Telehealth (INDEPENDENT_AMBULATORY_CARE_PROVIDER_SITE_OTHER): Payer: BC Managed Care – PPO | Admitting: Family Medicine

## 2022-06-08 DIAGNOSIS — R112 Nausea with vomiting, unspecified: Secondary | ICD-10-CM | POA: Diagnosis not present

## 2022-06-08 MED ORDER — ONDANSETRON HCL 4 MG PO TABS: ORAL_TABLET | ORAL | 1 refills | Status: DC

## 2022-06-08 NOTE — Progress Notes (Signed)
Primary Care / Sports Medicine Virtual Visit  Patient Information:  Patient ID: Jenna [name], female DOB: 1975/11/03 Age: 47 y.o. MRN: [id number]   Jenna [name] is a pleasant 47 y.o. female presenting with the following:  Chief Complaint  Patient presents with   Emesis    Since 930pm took wegovy at 7pm. Loose stools, last emesis 1.5hour    Review of Systems: No fevers, chills, night sweats, weight loss, chest pain, or shortness of breath.   Patient Active Problem List   Diagnosis Date Noted   Nausea and vomiting 06/08/2022   Screen for colon cancer 06/02/2022   Recurrent cold sores 04/11/2022   Encounter for weight management 03/14/2022   Annual physical exam 03/14/2022   Elevated blood pressure reading in office without diagnosis of hypertension 03/14/2022   Obesity (BMI 35.0-39.9 without comorbidity) 08/22/2016   Abnormal mammogram of left breast 04/14/2016   Past Medical History:  Diagnosis Date   Annual physical exam 03/14/2022   Breast mass    COVID-19 05/17/2022   Symptoms resolved 05/20/22   Morbid obesity ([place]) 08/03/2016   MRSA (methicillin resistant staph aureus) culture positive    Obesity    Outpatient Encounter Medications as of 06/08/2022  Medication Sig   ondansetron (ZOFRAN) 4 MG tablet Take 1 tablet PO every 4-6 hours as-needed for nausea and/or vomiting   WEGOVY 2.4 MG/0.75ML SOAJ Inject 2.4 mg into the skin once a week.   [DISCONTINUED] albuterol (PROVENTIL HFA;VENTOLIN HFA) 108 (90 Base) MCG/ACT inhaler Inhale 2 puffs into the lungs every 4 (four) hours as needed for wheezing.   [DISCONTINUED] medroxyPROGESTERone (PROVERA) 10 MG tablet Take 2 tablets (20 mg total) by mouth 3 (three) times daily for 7 days.   No facility-administered encounter medications on file as of 06/08/2022.   Past Surgical History:  Procedure Laterality Date   COLONOSCOPY WITH PROPOFOL N/A 06/02/2022   Procedure: COLONOSCOPY WITH PROPOFOL;  Surgeon: [name], MD;   Location: [place];  Service: Endoscopy;  Laterality: N/A;   DILATION AND CURETTAGE OF UTERUS  2000    Virtual Visit via MyChart [name]:   I connected with Jenna [name] and verified that I am speaking with the correct person using appropriate identifiers.   The limitations, risks, security and privacy concerns of performing an evaluation and management service by MyChart [name], including the higher likelihood of inaccurate diagnoses and treatments, and the availability of in person appointments were reviewed. The possible need of an additional face-to-face encounter for complete and high quality delivery of care was discussed. The patient was also made aware that there may be a patient responsible charge related to this service. The patient expressed understanding and wishes to proceed.  Provider location is in medical facility. Patient location is at their home, different from provider location. People involved in care of the patient during this telehealth encounter were myself, my nurse/medical assistant, and my front office/scheduling team member.  Objective findings:   General: Speaking full sentences, no audible heavy breathing. Sounds alert and appropriately interactive. Mildly ill-appearing. Face symmetric. Extraocular movements intact. Pupils equal and round. No nasal flaring or accessory muscle use visualized.  Independent interpretation of notes and tests performed by another provider:   None  Pertinent History, Exam, Impression, and Recommendations:   Nausea and vomiting Patient with severe nausea and emesis, episode of loose stools since last night (06/07/2022).  Of note, she restarted Wegovy 2.4 mg last night as  well.  Of note, she had been dealing with issues with the 1.7 mg dose that had resolved since her last visit with [place] on 04/11/2022.  She had been at her baseline, titrated the dose up with only transient nausea, held from this  dose due to both the holidays and colonoscopy which was performed on 06/02/2022, accounting for roughly 3 weeks without Wegovy.  She restarted this last night with the following symptoms.  Additionally, did have sushi last night with her husband, he has been asymptomatic.  She has had significant nausea and vomiting with any intake, was able to tolerate a few small sips of electrolyte liquid.  She continues to make urine and stool.  Current differential including adverse effect of Wegovy, viral gastroenteritis, alternate infectious etiology.  Plan for Zofran ODT, gentle liquid diet regimen, and low threshold to proceed to ER for IV fluids if unable to perform oral rehydration.  Patient expressed understanding and is amenable to this plan.  She will modify her follow-up to 2 weeks and refrain from [facility] dosing until follow-up discussion.  Orders & Medications Meds ordered this encounter  Medications   ondansetron (ZOFRAN) 4 MG tablet    Sig: Take 1 tablet PO every 4-6 hours as-needed for nausea and/or vomiting    Dispense:  20 tablet    Refill:  1   No orders of the defined types were placed in this encounter.    I discussed the above assessment and treatment plan with the patient. The patient was provided an opportunity to ask questions and all were answered. The patient agreed with the plan and demonstrated an understanding of the instructions.   The patient was advised to call back or seek an in-person evaluation if the symptoms worsen or if the condition fails to improve as anticipated.   I provided a total time of 30 minutes including both face-to-face and non-face-to-face time on 06/08/2022 inclusive of time utilized for medical chart review, information gathering, care coordination with staff, and documentation completion.    Montel Culver, MD, [facility]   Primary Care Sports Medicine Primary Care and Sports Medicine at [facility]

## 2022-06-08 NOTE — Assessment & Plan Note (Signed)
Patient with severe nausea and emesis, episode of loose stools since last night (06/07/2022).  Of note, she restarted Wegovy 2.4 mg last night as well.  Of note, she had been dealing with issues with the 1.7 mg dose that had resolved since her last visit with [place] on 04/11/2022.  She had been at her baseline, titrated the dose up with only transient nausea, held from this dose due to both the holidays and colonoscopy which was performed on 06/02/2022, accounting for roughly 3 weeks without Wegovy.  She restarted this last night with the following symptoms.  Additionally, did have sushi last night with her husband, he has been asymptomatic.  She has had significant nausea and vomiting with any intake, was able to tolerate a few small sips of electrolyte liquid.  She continues to make urine and stool.  Current differential including adverse effect of Wegovy, viral gastroenteritis, alternate infectious etiology.  Plan for Zofran ODT, gentle liquid diet regimen, and low threshold to proceed to ER for IV fluids if unable to perform oral rehydration.  Patient expressed understanding and is amenable to this plan.  She will modify her follow-up to 2 weeks and refrain from [facility] dosing until follow-up discussion.

## 2022-06-08 NOTE — Telephone Encounter (Signed)
Appointment made with Dr Zigmund Daniel today

## 2022-06-08 NOTE — Patient Instructions (Signed)
-   Use Zofran every 4-6 hours to control nausea - Perform gentle advance of ice chips, small sips of water, Gatorade - Can further advance diet as symptoms allow - If unable to keep liquids/food down despite medication, go to ER for possible IV fluids - Follow-up in person in 2 weeks - Hold from [facility] between now and then

## 2022-06-13 ENCOUNTER — Ambulatory Visit: Payer: BC Managed Care – PPO | Admitting: Family Medicine

## 2022-06-23 ENCOUNTER — Encounter: Payer: Self-pay | Admitting: Family Medicine

## 2022-06-23 ENCOUNTER — Ambulatory Visit (INDEPENDENT_AMBULATORY_CARE_PROVIDER_SITE_OTHER): Payer: BC Managed Care – PPO | Admitting: Family Medicine

## 2022-06-23 VITALS — BP 130/88 | HR 87 | Ht 64.0 in | Wt 201.0 lb

## 2022-06-23 DIAGNOSIS — Z7689 Persons encountering health services in other specified circumstances: Secondary | ICD-10-CM

## 2022-06-23 DIAGNOSIS — Z1211 Encounter for screening for malignant neoplasm of colon: Secondary | ICD-10-CM

## 2022-06-23 DIAGNOSIS — I10 Essential (primary) hypertension: Secondary | ICD-10-CM | POA: Diagnosis not present

## 2022-06-23 HISTORY — DX: Essential (primary) hypertension: I10

## 2022-06-23 MED ORDER — PHENTERMINE HCL 15 MG PO CAPS: 15.0000 mg | ORAL_CAPSULE | ORAL | 0 refills | Status: DC

## 2022-06-23 MED ORDER — AMLODIPINE BESYLATE 5 MG PO TABS: 5.0000 mg | ORAL_TABLET | Freq: Every day | ORAL | 0 refills | Status: DC

## 2022-06-23 MED ORDER — TOPIRAMATE ER 25 MG PO CAP24: 25.0000 mg | ORAL_CAPSULE | Freq: Every day | ORAL | 0 refills | Status: DC

## 2022-06-23 NOTE — Patient Instructions (Addendum)
-  Start amlodipine daily - Start phentermine daily on empty stomach - Start topiramate daily - Incorporate healthy lifestyle changes with diet/exercise - Return for follow-up in 1 month - Contact us for any question/concerns between now and then

## 2022-06-23 NOTE — Assessment & Plan Note (Signed)
Recently completed, was advised 10-year repeat.

## 2022-06-23 NOTE — Assessment & Plan Note (Signed)
Patient returns for weight management, has had issues throughout dosing of Wegovy at varying strengths without corresponding weight loss.  As such, we discussed alternate measures and she is amenable to initiation of phentermine 15 mg and topiramate 25 mg as an adjunct to healthy lifestyle changes.  Encouraged close monitoring of heart rate/palpitations, anxiety, sleep disturbance, bowel issues.  If these were to occur she is to contact her office.  Otherwise, return in 1 month for reevaluation, if tolerating well further titration to be considered.

## 2022-06-23 NOTE — Progress Notes (Signed)
     Primary Care / Sports Medicine Office Visit  Patient Information:  Patient ID: Jenna Price, female DOB: 11/09/1975 Age: 47 y.o. MRN: [id number]   Jenna Price is a pleasant 47 y.o. female presenting with the following:  Chief Complaint  Patient presents with   Hypertension   Weight Check    Vitals:   06/23/22 0801  BP: 130/88  Pulse: 87  SpO2: 99%   Vitals:   06/23/22 0801  Weight: 201 lb (91.2 kg)  Height: 5\' 4"  (1.626 m)   Body mass index is 34.5 kg/m.  No results found.   Independent interpretation of notes and tests performed by another provider:   None  Procedures performed:   None  Pertinent History, Exam, Impression, and Recommendations:   Jenna Price was seen today for hypertension and weight check.  Primary hypertension Assessment & Plan: Persistent elevation noted, denies any cardiopulmonary complaints.  Examination with reassuring cardiac and pulmonary findings.    Given concurrent plan for weight loss medications there is increased need to maintain tight blood pressure control.  As such, amlodipine initiated, lifestyle modifications encouraged.  Will track this at her return to determine the need for titration.  Orders: -     amLODIPine Besylate; Take 1 tablet (5 mg total) by mouth daily.  Dispense: 90 tablet; Refill: 0  Encounter for weight management Assessment & Plan: Patient returns for weight management, has had issues throughout dosing of Wegovy at varying strengths without corresponding weight loss.  As such, we discussed alternate measures and she is amenable to initiation of phentermine 15 mg and topiramate 25 mg as an adjunct to healthy lifestyle changes.  Encouraged close monitoring of heart rate/palpitations, anxiety, sleep disturbance, bowel issues.  If these were to occur she is to contact her office.  Otherwise, return in 1 month for reevaluation, if tolerating well further titration to be considered.  Orders: -     Phentermine  HCl; Take 1 capsule (15 mg total) by mouth every morning.  Dispense: 30 capsule; Refill: 0 -     Topiramate ER; Take 1 capsule (25 mg total) by mouth daily.  Dispense: 30 capsule; Refill: 0  Screen for colon cancer Assessment & Plan: Recently completed, was advised 10-year repeat.      Orders & Medications Meds ordered this encounter  Medications   phentermine 15 MG capsule    Sig: Take 1 capsule (15 mg total) by mouth every morning.    Dispense:  30 capsule    Refill:  0   Topiramate ER (TROKENDI XR) 25 MG CP24    Sig: Take 1 capsule (25 mg total) by mouth daily.    Dispense:  30 capsule    Refill:  0    Capsule or tablet OK   amLODipine (NORVASC) 5 MG tablet    Sig: Take 1 tablet (5 mg total) by mouth daily.    Dispense:  90 tablet    Refill:  0   No orders of the defined types were placed in this encounter.    No follow-ups on file.     Montel Culver, MD, [facility]   Primary Care Sports Medicine Primary Care and Sports Medicine at [facility]

## 2022-06-23 NOTE — Telephone Encounter (Signed)
Please review.  KP

## 2022-06-23 NOTE — Assessment & Plan Note (Signed)
Persistent elevation noted, denies any cardiopulmonary complaints.  Examination with reassuring cardiac and pulmonary findings.    Given concurrent plan for weight loss medications there is increased need to maintain tight blood pressure control.  As such, amlodipine initiated, lifestyle modifications encouraged.  Will track this at her return to determine the need for titration.

## 2022-06-23 NOTE — Addendum Note (Signed)
Addended by: Montel Culver on: 06/23/2022 10:23 AM   Modules accepted: Orders

## 2022-07-17 ENCOUNTER — Ambulatory Visit (INDEPENDENT_AMBULATORY_CARE_PROVIDER_SITE_OTHER): Payer: BC Managed Care – PPO | Admitting: Family Medicine

## 2022-07-17 ENCOUNTER — Encounter: Payer: Self-pay | Admitting: Family Medicine

## 2022-07-17 VITALS — BP 118/68 | HR 87 | Ht 64.0 in | Wt 202.0 lb

## 2022-07-17 DIAGNOSIS — Z7689 Persons encountering health services in other specified circumstances: Secondary | ICD-10-CM | POA: Diagnosis not present

## 2022-07-17 DIAGNOSIS — I10 Essential (primary) hypertension: Secondary | ICD-10-CM

## 2022-07-17 MED ORDER — METFORMIN HCL ER 750 MG PO TB24: 750.0000 mg | ORAL_TABLET | Freq: Every day | ORAL | 0 refills | Status: DC

## 2022-07-17 MED ORDER — TOPIRAMATE ER 25 MG PO CAP24: 50.0000 mg | ORAL_CAPSULE | Freq: Every day | ORAL | 0 refills | Status: DC

## 2022-07-17 MED ORDER — PHENTERMINE HCL 30 MG PO CAPS: 30.0000 mg | ORAL_CAPSULE | ORAL | 0 refills | Status: DC

## 2022-07-17 MED ORDER — TOPIRAMATE ER 50 MG PO CAP24: 50.0000 mg | ORAL_CAPSULE | Freq: Every day | ORAL | 0 refills | Status: DC

## 2022-07-17 NOTE — Assessment & Plan Note (Signed)
Chronic, controlled, asymptomatic from a cardiopulmonary standpoint.

## 2022-07-17 NOTE — Patient Instructions (Signed)
-   Start new dose of phentermine 30 mg daily - Start new dose of topiramate 50 mg daily - Start metformin 750 mg daily with food - Continue blood pressure medication and healthy lifestyle changes - Can talk to your insurance company about Zepbound (tirzepatide) for [employer] management given your previous treatment and intolerance of Wegovy (semaglutide) - Return for follow-up in 1 month

## 2022-07-17 NOTE — Addendum Note (Signed)
Addended by: Montel Culver on: 07/17/2022 10:30 AM   Modules accepted: Orders

## 2022-07-17 NOTE — Assessment & Plan Note (Signed)
Has tolerated phentermine low-dose without adverse effects reported, vitals stable and reassuring.  Plan for further titration to phentermine 30 mg, increase topiramate to 50 mg, adjunct metformin newly prescribed today.  Will keep close track at her return in 1 month.  Encouraged continued adherence to lifestyle modifications.

## 2022-07-17 NOTE — Progress Notes (Signed)
     Primary Care / Sports Medicine Office Visit  Patient Information:  Patient ID: Jenna Price, female DOB: January 26, 1976 Age: 47 y.o. MRN: [id number]   Jenna Price is a pleasant 47 y.o. female presenting with the following:  Chief Complaint  Patient presents with   Weight Check    Vitals:   07/17/22 0826  BP: 118/68  Pulse: 87  SpO2: 100%   Vitals:   07/17/22 0826  Weight: 202 lb (91.6 kg)  Height: 5' 4"$  (1.626 m)   Body mass index is 34.67 kg/m.  No results found.   Independent interpretation of notes and tests performed by another provider:   None  Procedures performed:   None  Pertinent History, Exam, Impression, and Recommendations:   Jenna Price was seen today for weight check.  Primary hypertension Assessment & Plan: Chronic, controlled, asymptomatic from a cardiopulmonary standpoint.   Encounter for weight management Assessment & Plan: Has tolerated phentermine low-dose without adverse effects reported, vitals stable and reassuring.  Plan for further titration to phentermine 30 mg, increase topiramate to 50 mg, adjunct metformin newly prescribed today.  Will keep close track at her return in 1 month.  Encouraged continued adherence to lifestyle modifications.  Orders: -     Phentermine HCl; Take 1 capsule (30 mg total) by mouth every morning.  Dispense: 30 capsule; Refill: 0 -     Topiramate ER; Take 1 capsule (50 mg total) by mouth daily.  Dispense: 30 capsule; Refill: 0 -     metFORMIN HCl ER; Take 1 tablet (750 mg total) by mouth daily with breakfast.  Dispense: 90 tablet; Refill: 0     Orders & Medications Meds ordered this encounter  Medications   phentermine 30 MG capsule    Sig: Take 1 capsule (30 mg total) by mouth every morning.    Dispense:  30 capsule    Refill:  0   Topiramate ER (TROKENDI XR) 50 MG CP24    Sig: Take 1 capsule (50 mg total) by mouth daily.    Dispense:  30 capsule    Refill:  0   metFORMIN (GLUCOPHAGE-XR) 750 MG  24 hr tablet    Sig: Take 1 tablet (750 mg total) by mouth daily with breakfast.    Dispense:  90 tablet    Refill:  0   No orders of the defined types were placed in this encounter.    No follow-ups on file.     Montel Culver, MD, [facility]   Primary Care Sports Medicine Primary Care and Sports Medicine at [facility]

## 2022-07-18 ENCOUNTER — Other Ambulatory Visit: Payer: Self-pay

## 2022-07-18 ENCOUNTER — Other Ambulatory Visit: Payer: Self-pay | Admitting: Family Medicine

## 2022-07-18 ENCOUNTER — Telehealth: Payer: Self-pay

## 2022-07-18 NOTE — Telephone Encounter (Signed)
Spoke to [facility]. Pt should be able to get medication with 30$ co pay.  KP

## 2022-07-18 NOTE — Telephone Encounter (Signed)
Topiramate not covered by insurance.  Alternatives that can be sent in: carbamazepine, carbamazepine ext-rel, clobazam, clonazepam, divalproex sodium, divalproex sodium ext-rel, gabapentin, lacosamide, lamotrigine, lamotrigine ext-rel, levetiracetam, levetiracetam ext-rel, oxcarbazepine, phenobarbital, phenytoin, phenytoin sodium extended, primidone, rufinamide, tiagabine, topiramate, valproic acid, zonisamide, APTIOM, FYCOMPA, OXTELLAR XR, TROKENDI XR, XCOPRI  KP

## 2022-07-18 NOTE — Telephone Encounter (Signed)
Called pt left VM to call back.  PEC may give results if patient returns call - CRM created.  KP

## 2022-07-19 NOTE — Telephone Encounter (Signed)
Spoke to insurance pt should be able to get a 30 day supply for 30$.  KP

## 2022-07-29 ENCOUNTER — Other Ambulatory Visit: Payer: Self-pay | Admitting: Family Medicine

## 2022-07-29 DIAGNOSIS — Z7689 Persons encountering health services in other specified circumstances: Secondary | ICD-10-CM

## 2022-07-31 NOTE — Telephone Encounter (Signed)
Unable to refill per protocol, Rx request is too soon. Last refill 07/17/22 for 30 days.  Requested Prescriptions  Pending Prescriptions Disp Refills   Topiramate ER (TROKENDI XR) 25 MG CP24 [Pharmacy Med Name: TOPIRAMATE ER '25MG'$  CAPSULES] 30 capsule     Sig: TAKE 1 CAPSULE(25 MG) BY MOUTH DAILY     Neurology: Anticonvulsants - topiramate & zonisamide Passed - 07/29/2022  6:15 PM      Passed - Cr in normal range and within 360 days    Creat  Date Value Ref Range Status  11/29/2015 0.74 0.50 - 1.10 mg/dL Final   Creatinine, Ser  Date Value Ref Range Status  04/11/2022 0.87 0.57 - 1.00 mg/dL Final         Passed - CO2 in normal range and within 360 days    CO2  Date Value Ref Range Status  04/11/2022 20 20 - 29 mmol/L Final         Passed - ALT in normal range and within 360 days    ALT  Date Value Ref Range Status  04/11/2022 10 0 - 32 IU/L Final         Passed - AST in normal range and within 360 days    AST  Date Value Ref Range Status  04/11/2022 13 0 - 40 IU/L Final         Passed - Completed PHQ-2 or PHQ-9 in the last 360 days      Passed - Valid encounter within last 12 months    Recent Outpatient Visits           2 weeks ago Primary hypertension   [place] [place] [facility] [place], Earley Abide, MD   1 month ago Primary hypertension   [place] [facility] [place], Earley Abide, MD   1 month ago Nausea and vomiting, unspecified vomiting type   [facility] [place] [facility] [place], Earley Abide, MD   3 months ago Annual physical exam   [place] [facility] [place], Earley Abide, MD   4 months ago Encounter for weight management   [place] [facility], Earley Abide, MD       Future Appointments             In 4 weeks Zigmund Daniel, Earley Abide, MD [place] [facility], [facility]

## 2022-08-16 ENCOUNTER — Encounter: Payer: Self-pay | Admitting: Family Medicine

## 2022-08-16 NOTE — Telephone Encounter (Signed)
Please review.  KP

## 2022-08-23 NOTE — Progress Notes (Unsigned)
PCP: Montel Culver, MD   No chief complaint on file.   HPI:      [name] is a 47 y.o. No obstetric history on file. whose LMP was No LMP recorded., presents today for her annual examination.  Her menses are regular every 28-30 days, lasting 5 days.  Dysmenorrhea {dysmen:716}. She {does:18564} have intermenstrual bleeding. She {does:18564} have vasomotor sx.   Sex activity: {sex active: 315163}. She {does:18564} have vaginal dryness.  Last Pap: 03/22/21 Results were: no abnormalities /neg HPV DNA.  Hx of STDs: {STD hx:14358}  Last mammogram: 04/25/22  Results were: normal--routine follow-up in 12 months There is no FH of breast cancer. There is no FH of ovarian cancer. The patient {does:18564} do self-breast exams.  Colonoscopy: 1/24 with Dr. Allen Norris, Repeat due after 10*** years.   Tobacco use: {tob:20664} Alcohol use: {Alcohol:11675} No drug use Exercise: {exercise:31265}  She {does:18564} get adequate calcium and Vitamin D in her diet.  Labs with PCP.   Patient Active Problem List   Diagnosis Date Noted   Screen for colon cancer 06/02/2022   Encounter for weight management 03/14/2022   Annual physical exam 03/14/2022   Primary hypertension 03/14/2022   Obesity (BMI 35.0-39.9 without comorbidity) 08/22/2016   Abnormal mammogram of left breast 04/14/2016    Past Surgical History:  Procedure Laterality Date   COLONOSCOPY WITH PROPOFOL N/A 06/02/2022   Procedure: COLONOSCOPY WITH PROPOFOL;  Surgeon: [name], MD;  Location: [place];  Service: Endoscopy;  Laterality: N/A;   DILATION AND CURETTAGE OF UTERUS  2000    Family History  Problem Relation Age of Onset   Breast cancer Maternal Aunt        mat great aunt   Drug abuse Mother    Heart disease Mother    Other Father        unknown medical history    Social History   Socioeconomic History   Marital status: Married    Spouse name: Not on file   Number of children: Not on file    Years of education: Not on file   Highest education level: Not on file  Occupational History   Not on file  Tobacco Use   Smoking status: Never   Smokeless tobacco: Never  Vaping Use   Vaping Use: Never used  Substance and Sexual Activity   Alcohol use: Not Currently    Comment: rarely   Drug use: No   Sexual activity: Yes    Birth control/protection: None  Other Topics Concern   Not on file  Social History Narrative   Not on file   Social Determinants of Health   Financial Resource Strain: Not on file  Food Insecurity: No Food Insecurity (03/14/2022)   Hunger Vital Sign    Worried About Running Out of Food in the Last Year: Never true    Ran Out of Food in the Last Year: Never true  Transportation Needs: No Transportation Needs (03/14/2022)   PRAPARE - Hydrologist (Medical): No    Lack of Transportation (Non-Medical): No  Physical Activity: Not on file  Stress: Not on file  Social Connections: Not on file  Intimate Partner Violence: Not At Risk (03/14/2022)   Humiliation, Afraid, Rape, and Kick questionnaire    Fear of Current or Ex-Partner: No    Emotionally Abused: No    Physically Abused: No    Sexually Abused: No     Current Outpatient Medications:  amLODipine (NORVASC) 5 MG tablet, Take 1 tablet (5 mg total) by mouth daily., Disp: 90 tablet, Rfl: 0   metFORMIN (GLUCOPHAGE-XR) 750 MG 24 hr tablet, Take 1 tablet (750 mg total) by mouth daily with breakfast., Disp: 90 tablet, Rfl: 0   phentermine 30 MG capsule, Take 1 capsule (30 mg total) by mouth every morning., Disp: 30 capsule, Rfl: 0   Topiramate ER (TROKENDI XR) 25 MG CP24, Take 2 capsules (50 mg total) by mouth daily., Disp: 60 capsule, Rfl: 0     ROS:  Review of Systems BREAST: No symptoms    Objective: There were no vitals taken for this visit.   OBGyn Exam  Results: No results found for this or any previous visit (from the past 24  hour(s)).  Assessment/Plan:  No diagnosis found.   No orders of the defined types were placed in this encounter.           GYN counsel {counseling: 16159}    F/U  No follow-ups on file.  Mareon Robinette B. Tyreon Frigon, PA-C 08/23/2022 12:29 PM

## 2022-08-24 ENCOUNTER — Encounter: Payer: Self-pay | Admitting: Obstetrics and Gynecology

## 2022-08-24 ENCOUNTER — Ambulatory Visit (INDEPENDENT_AMBULATORY_CARE_PROVIDER_SITE_OTHER): Payer: BC Managed Care – PPO | Admitting: Obstetrics and Gynecology

## 2022-08-24 VITALS — Ht 63.0 in | Wt 199.0 lb

## 2022-08-24 DIAGNOSIS — Z1211 Encounter for screening for malignant neoplasm of colon: Secondary | ICD-10-CM

## 2022-08-24 DIAGNOSIS — Z01419 Encounter for gynecological examination (general) (routine) without abnormal findings: Secondary | ICD-10-CM | POA: Diagnosis not present

## 2022-08-24 DIAGNOSIS — Z1231 Encounter for screening mammogram for malignant neoplasm of breast: Secondary | ICD-10-CM

## 2022-08-24 NOTE — Patient Instructions (Signed)
I value your feedback and you entrusting us with your care. If you get a [facility] patient survey, I would appreciate you taking the time to let us know about your experience today. Thank you! ? ? ?

## 2022-08-25 ENCOUNTER — Other Ambulatory Visit: Payer: Self-pay

## 2022-08-25 DIAGNOSIS — Z7689 Persons encountering health services in other specified circumstances: Secondary | ICD-10-CM

## 2022-08-25 MED ORDER — ZEPBOUND 2.5 MG/0.5ML ~~LOC~~ SOAJ: 2.5000 mg | SUBCUTANEOUS | 0 refills | Status: DC

## 2022-08-28 ENCOUNTER — Encounter: Payer: Self-pay | Admitting: Family Medicine

## 2022-08-28 ENCOUNTER — Ambulatory Visit (INDEPENDENT_AMBULATORY_CARE_PROVIDER_SITE_OTHER): Payer: BC Managed Care – PPO | Admitting: Family Medicine

## 2022-08-28 VITALS — BP 124/80 | HR 88 | Ht 64.0 in | Wt 203.0 lb

## 2022-08-28 DIAGNOSIS — I10 Essential (primary) hypertension: Secondary | ICD-10-CM | POA: Diagnosis not present

## 2022-08-28 DIAGNOSIS — Z6834 Body mass index (BMI) 34.0-34.9, adult: Secondary | ICD-10-CM | POA: Diagnosis not present

## 2022-08-28 DIAGNOSIS — E669 Obesity, unspecified: Secondary | ICD-10-CM | POA: Insufficient documentation

## 2022-08-28 DIAGNOSIS — Z7689 Persons encountering health services in other specified circumstances: Secondary | ICD-10-CM

## 2022-08-28 DIAGNOSIS — E6609 Other obesity due to excess calories: Secondary | ICD-10-CM

## 2022-08-28 MED ORDER — PHENTERMINE HCL 37.5 MG PO CAPS
37.5000 mg | ORAL_CAPSULE | ORAL | 0 refills | Status: DC
Start: 2022-08-28 — End: 2023-03-05

## 2022-08-28 MED ORDER — TOPIRAMATE ER 50 MG PO CAP24: 50.0000 mg | ORAL_CAPSULE | Freq: Two times a day (BID) | ORAL | 2 refills | Status: DC

## 2022-08-28 NOTE — Patient Instructions (Signed)
-   Referral coordinator will contact you to schedule visit with bariatric surgery - Take new dose of phentermine (37.5 mg daily on empty stomach) - Increase topiramate to 50 mg twice daily - Continue metformin at current dose - Contact us for any updates regarding Zepbound coverage - Return for follow-up in 1 month

## 2022-08-28 NOTE — Progress Notes (Signed)
     Primary Care / Sports Medicine Office Visit  Patient Information:  Patient ID: Jenna Price, female DOB: Sep 03, 1975 Age: 47 y.o. MRN: [id number]   Jenna Price is a pleasant 47 y.o. female presenting with the following:  Chief Complaint  Patient presents with   Hypertension    Vitals:   08/28/22 0831  BP: 124/80  Pulse: 88  SpO2: 98%   Vitals:   08/28/22 0831  Weight: 203 lb (92.1 kg)  Height: 5\' 4"  (1.626 m)   Body mass index is 34.84 kg/m.  No results found.   Independent interpretation of notes and tests performed by another provider:   None  Procedures performed:   None  Pertinent History, Exam, Impression, and Recommendations:   Jenna Price was seen today for hypertension.  Class 1 obesity due to excess calories with serious comorbidity and body mass index (BMI) of 34.0 to 34.9 in adult Overview: Past and ongoing therapies: - Wegovy - Phentermine - Metformin - Topiramate  Assessment & Plan: Chronic, ongoing despite various pharmacotherapeutic options over the past, in the setting of comorbid hypertension.  Plan as follows: - Continue metformin at current dose - Titrate topiramate to 50 mg twice daily - Titrate phentermine to 37.5 mg - Prior authorization completed for Zepbound due to [facility] intolerance - Referral to bariatric surgery for surgical treatment options - Follow-up in 1 month  Orders: -     Amb Referral to Bariatric Surgery  Encounter for weight management -     Topiramate ER; Take 1 capsule (50 mg total) by mouth in the morning and at bedtime.  Dispense: 60 capsule; Refill: 2 -     Phentermine HCl; Take 1 capsule (37.5 mg total) by mouth every morning.  Dispense: 30 capsule; Refill: 0  Primary hypertension Assessment & Plan: No BP issues noted despite phentermine, denies cardiopulmonary complaints.  -Continue current regimen      Orders & Medications Meds ordered this encounter  Medications   Topiramate ER (TROKENDI  XR) 50 MG CP24    Sig: Take 1 capsule (50 mg total) by mouth in the morning and at bedtime.    Dispense:  60 capsule    Refill:  2   phentermine 37.5 MG capsule    Sig: Take 1 capsule (37.5 mg total) by mouth every morning.    Dispense:  30 capsule    Refill:  0   Orders Placed This Encounter  Procedures   Amb Referral to Bariatric Surgery     No follow-ups on file.     Montel Culver, MD, [facility]   Primary Care Sports Medicine Primary Care and Sports Medicine at [facility]

## 2022-08-28 NOTE — Assessment & Plan Note (Signed)
No BP issues noted despite phentermine, denies cardiopulmonary complaints.  -Continue current regimen

## 2022-08-28 NOTE — Assessment & Plan Note (Signed)
Chronic, ongoing despite various pharmacotherapeutic options over the past, in the setting of comorbid hypertension.  Plan as follows: - Continue metformin at current dose - Titrate topiramate to 50 mg twice daily - Titrate phentermine to 37.5 mg - Prior authorization completed for Zepbound due to [facility] intolerance - Referral to bariatric surgery for surgical treatment options - Follow-up in 1 month

## 2022-11-16 ENCOUNTER — Encounter: Payer: Self-pay | Admitting: Family Medicine

## 2022-11-17 NOTE — Telephone Encounter (Signed)
Please advise 

## 2022-12-18 ENCOUNTER — Other Ambulatory Visit: Payer: Self-pay

## 2022-12-18 ENCOUNTER — Encounter: Payer: Self-pay | Admitting: Family Medicine

## 2022-12-18 DIAGNOSIS — Z7689 Persons encountering health services in other specified circumstances: Secondary | ICD-10-CM

## 2022-12-18 MED ORDER — ZEPBOUND 2.5 MG/0.5ML ~~LOC~~ SOAJ
2.5000 mg | SUBCUTANEOUS | 0 refills | Status: DC
Start: 2022-12-18 — End: 2023-03-05

## 2022-12-18 NOTE — Telephone Encounter (Signed)
Not sure about this. Form ready to be filled out

## 2022-12-26 ENCOUNTER — Other Ambulatory Visit: Payer: Self-pay | Admitting: Family Medicine

## 2022-12-26 ENCOUNTER — Encounter (INDEPENDENT_AMBULATORY_CARE_PROVIDER_SITE_OTHER): Payer: BC Managed Care – PPO | Admitting: Family Medicine

## 2022-12-26 DIAGNOSIS — B001 Herpesviral vesicular dermatitis: Secondary | ICD-10-CM

## 2022-12-26 MED ORDER — VALACYCLOVIR HCL 1 G PO TABS
2000.0000 mg | ORAL_TABLET | Freq: Two times a day (BID) | ORAL | 0 refills | Status: AC
Start: 2022-12-26 — End: 2022-12-27

## 2022-12-26 NOTE — Telephone Encounter (Signed)
Please see the MyChart message reply(ies) for my assessment and plan.    This patient gave consent for this Medical Advice Message and is aware that it may result in a bill to [employer], as well as the possibility of receiving a bill for a co-payment or deductible. They are an established patient, but are not seeking medical advice exclusively about a problem treated during an in person or video visit in the last seven days. I did not recommend an in person or video visit within seven days of my reply.    I spent a total of 14 minutes cumulative time within 7 days through [employer].  Jerrol Banana, MD

## 2022-12-26 NOTE — Telephone Encounter (Signed)
I dont see anything previously ordered

## 2023-03-05 ENCOUNTER — Ambulatory Visit (INDEPENDENT_AMBULATORY_CARE_PROVIDER_SITE_OTHER): Payer: BC Managed Care – PPO | Admitting: Family Medicine

## 2023-03-05 ENCOUNTER — Encounter: Payer: Self-pay | Admitting: Family Medicine

## 2023-03-05 VITALS — BP 122/80 | HR 84 | Ht 64.0 in | Wt 239.0 lb

## 2023-03-05 DIAGNOSIS — G43001 Migraine without aura, not intractable, with status migrainosus: Secondary | ICD-10-CM | POA: Insufficient documentation

## 2023-03-05 DIAGNOSIS — Z7689 Persons encountering health services in other specified circumstances: Secondary | ICD-10-CM

## 2023-03-05 MED ORDER — WEGOVY 0.25 MG/0.5ML ~~LOC~~ SOAJ
0.2500 mg | SUBCUTANEOUS | 0 refills | Status: DC
Start: 2023-03-05 — End: 2024-04-03

## 2023-03-05 MED ORDER — ZEPBOUND 2.5 MG/0.5ML ~~LOC~~ SOAJ
2.5000 mg | SUBCUTANEOUS | 1 refills | Status: DC
Start: 2023-03-05 — End: 2023-03-05

## 2023-03-05 MED ORDER — TOPIRAMATE ER 25 MG PO CAP24
25.0000 mg | ORAL_CAPSULE | Freq: Every day | ORAL | 0 refills | Status: DC
Start: 2023-03-05 — End: 2023-03-13

## 2023-03-05 MED ORDER — BUPROPION HCL ER (SR) 150 MG PO TB12
ORAL_TABLET | ORAL | 0 refills | Status: DC
Start: 2023-03-05 — End: 2023-05-31

## 2023-03-05 MED ORDER — SUMATRIPTAN SUCCINATE 50 MG PO TABS
50.0000 mg | ORAL_TABLET | ORAL | 2 refills | Status: AC | PRN
Start: 2023-03-05 — End: ?

## 2023-03-05 MED ORDER — WEGOVY 0.5 MG/0.5ML ~~LOC~~ SOAJ
0.5000 mg | SUBCUTANEOUS | 0 refills | Status: DC
Start: 2023-03-05 — End: 2023-05-25

## 2023-03-05 NOTE — Assessment & Plan Note (Addendum)
Is interested in restarting pharmacotherapy.  Plan: - Start topiramate - Start bupropion - Rx sent for compounded semaglutide - Diet and exercise recommendations

## 2023-03-05 NOTE — Progress Notes (Signed)
Primary Care / Sports Medicine Office Visit  Patient Information:  Patient ID: Jenna Price, female DOB: 12/20/1975 Age: 47 y.o. 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 female presenting with the following:  Chief Complaint  Patient presents with   Headache    Off and on, Wednesday been pretty bad. Neck area    Vitals:   03/05/23 1416  BP: 122/80  Pulse: 84  SpO2: 98%   Vitals:   03/05/23 1416  Weight: 239 lb (108.4 kg)  Height: 5\' 4"  (1.626 m)   Body mass index is 41.02 kg/m.  No results found.   Independent interpretation of notes and tests performed by another provider:   None  Procedures performed:   None  Pertinent History, Exam, Impression, and Recommendations:   Problem List Items Addressed This Visit       Cardiovascular and Mediastinum   Migraine without aura and with status migrainosus, not intractable - Primary    Progressive, chronic, she has days of symptoms despite high doses of APAP and Excedrin, element of incapacitation, some blurred vision, timing around menstrual cycle. Describes neck, temporal involvement without symmetry.   Neuro and MSK findings benign. Findings consistent with migraine.  Plan: - Start topiramate - Use sumatriptan as-needed      Relevant Medications   SUMAtriptan (IMITREX) 50 MG tablet   Topiramate ER (TROKENDI XR) 25 MG CP24   buPROPion (WELLBUTRIN SR) 150 MG 12 hr tablet     Other   Encounter for weight management    Is interested in restarting pharmacotherapy.  Plan: - Start topiramate - Start bupropion - Rx sent for compounded semaglutide - Diet and exercise recommendations      Relevant Medications   Topiramate ER (TROKENDI XR) 25 MG CP24   buPROPion (WELLBUTRIN SR) 150 MG 12 hr tablet   WEGOVY 0.5 MG/0.5ML SOAJ   WEGOVY 0.25 MG/0.5ML SOAJ     Orders & Medications Medications:  Meds ordered this encounter  Medications   SUMAtriptan (IMITREX) 50 MG tablet    Sig: Take 1  tablet (50 mg total) by mouth every 2 (two) hours as needed for migraine. May repeat in 2 hours if headache persists or recurs.    Dispense:  30 tablet    Refill:  2   Topiramate ER (TROKENDI XR) 25 MG CP24    Sig: Take 1 capsule (25 mg total) by mouth daily. X 7 days then increase to next dose    Dispense:  7 capsule    Refill:  0   buPROPion (WELLBUTRIN SR) 150 MG 12 hr tablet    Sig: Take 150 mg PO once daily for 3 days then increase to 150 mg PO twice daily.    Dispense:  180 tablet    Refill:  0   DISCONTD: tirzepatide (ZEPBOUND) 2.5 MG/0.5ML Pen    Sig: Inject 2.5 mg into the skin once a week.    Dispense:  2 mL    Refill:  1    Ok to compound   WEGOVY 0.5 MG/0.5ML SOAJ    Sig: Inject 0.5 mg into the skin once a week. Use this dose for 1 month (4 shots) and then increase to next higher dose.    Dispense:  2 mL    Refill:  0    Ok to compound   WEGOVY 0.25 MG/0.5ML SOAJ    Sig: Inject 0.25 mg into the skin once a week. Use this dose  for 1 month (4 shots) and then increase to next higher dose.    Dispense:  2 mL    Refill:  0    Ok to compound   No orders of the defined types were placed in this encounter.    No follow-ups on file.     Jerrol Banana, MD, [facility]   Primary Care Sports Medicine Primary Care and Sports Medicine at [facility]

## 2023-03-05 NOTE — Assessment & Plan Note (Signed)
Progressive, chronic, she has days of symptoms despite high doses of APAP and Excedrin, element of incapacitation, some blurred vision, timing around menstrual cycle. Describes neck, temporal involvement without symmetry.   Neuro and MSK findings benign. Findings consistent with migraine.  Plan: - Start topiramate - Use sumatriptan as-needed

## 2023-03-13 ENCOUNTER — Other Ambulatory Visit: Payer: Self-pay | Admitting: Family Medicine

## 2023-03-13 ENCOUNTER — Encounter: Payer: Self-pay | Admitting: Family Medicine

## 2023-03-13 DIAGNOSIS — Z7689 Persons encountering health services in other specified circumstances: Secondary | ICD-10-CM

## 2023-03-13 MED ORDER — TOPIRAMATE ER 50 MG PO CAP24
50.0000 mg | ORAL_CAPSULE | Freq: Every day | ORAL | 0 refills | Status: DC
Start: 2023-03-13 — End: 2023-05-25

## 2023-03-13 NOTE — Telephone Encounter (Signed)
Please review.  KP

## 2023-04-30 ENCOUNTER — Ambulatory Visit
Admission: RE | Admit: 2023-04-30 | Discharge: 2023-04-30 | Disposition: A | Discharge status: Home / Self Care | Payer: BC Managed Care – PPO | Source: Ambulatory Visit | Attending: Obstetrics and Gynecology | Admitting: Obstetrics and Gynecology

## 2023-04-30 DIAGNOSIS — Z1231 Encounter for screening mammogram for malignant neoplasm of breast: Secondary | ICD-10-CM | POA: Diagnosis present

## 2023-05-02 ENCOUNTER — Other Ambulatory Visit: Payer: Self-pay | Admitting: Obstetrics and Gynecology

## 2023-05-02 DIAGNOSIS — R928 Other abnormal and inconclusive findings on diagnostic imaging of breast: Secondary | ICD-10-CM

## 2023-05-04 ENCOUNTER — Ambulatory Visit
Admission: RE | Admit: 2023-05-04 | Discharge: 2023-05-04 | Disposition: A | Discharge status: Home / Self Care | Payer: BC Managed Care – PPO | Source: Ambulatory Visit | Attending: Obstetrics and Gynecology | Admitting: Obstetrics and Gynecology

## 2023-05-04 DIAGNOSIS — R928 Other abnormal and inconclusive findings on diagnostic imaging of breast: Secondary | ICD-10-CM | POA: Diagnosis present

## 2023-05-05 ENCOUNTER — Encounter: Payer: Self-pay | Admitting: Obstetrics and Gynecology

## 2023-05-08 ENCOUNTER — Encounter: Payer: Self-pay | Admitting: Family Medicine

## 2023-05-08 NOTE — Telephone Encounter (Signed)
Please review.  KP

## 2023-05-25 ENCOUNTER — Ambulatory Visit
Admission: EM | Admit: 2023-05-25 | Discharge: 2023-05-25 | Disposition: A | Discharge status: Home / Self Care | Payer: BC Managed Care – PPO | Attending: Emergency Medicine | Admitting: Emergency Medicine

## 2023-05-25 ENCOUNTER — Encounter: Payer: Self-pay | Admitting: Emergency Medicine

## 2023-05-25 ENCOUNTER — Other Ambulatory Visit: Payer: Self-pay | Admitting: Family Medicine

## 2023-05-25 ENCOUNTER — Encounter: Payer: Self-pay | Admitting: Family Medicine

## 2023-05-25 DIAGNOSIS — N390 Urinary tract infection, site not specified: Secondary | ICD-10-CM | POA: Diagnosis present

## 2023-05-25 LAB — URINALYSIS, W/ REFLEX TO CULTURE (INFECTION SUSPECTED)
Bilirubin Urine: NEGATIVE
Glucose, UA: NEGATIVE mg/dL
Ketones, ur: NEGATIVE mg/dL
Leukocytes,Ua: NEGATIVE
Nitrite: NEGATIVE
Protein, ur: 30 mg/dL — AB
Specific Gravity, Urine: >1.03 — ABNORMAL HIGH (ref 1.005–1.030)
pH: 5.5 (ref 5.0–8.0)

## 2023-05-25 MED ORDER — NITROFURANTOIN MONOHYD MACRO 100 MG PO CAPS: 100.0000 mg | ORAL_CAPSULE | Freq: Two times a day (BID) | ORAL | 0 refills | Status: DC

## 2023-05-25 MED ORDER — PHENAZOPYRIDINE HCL 200 MG PO TABS: 200.0000 mg | ORAL_TABLET | Freq: Three times a day (TID) | ORAL | 0 refills | Status: DC

## 2023-05-25 NOTE — Discharge Instructions (Addendum)
Take the Macrobid twice daily for 5 days with food for treatment of urinary tract infection. ° °Use the Pyridium every 8 hours as needed for urinary discomfort.  This will turn your urine a bright red-orange. ° °Increase your oral fluid intake so that you increase your urine production and or flushing your urinary system. ° °Take an over-the-counter probiotic, such as Culturelle-Align-Activia, 1 hour after each dose of antibiotic to prevent diarrhea or yeast infections from forming. ° °We will culture urine and change the antibiotics if necessary. ° °Return for reevaluation, or see your primary care provider, for any new or worsening symptoms.  °

## 2023-05-25 NOTE — ED Provider Notes (Signed)
[facility]    CSN: [id number] Arrival date & time: 05/25/23  1739      History   Chief Complaint Chief Complaint  Patient presents with   Back Pain   Urinary Frequency    HPI Jenna Price is a 47 y.o. female.   HPI  47 year old female with past medical history significant for obesity, hypertension, MRSA, and breast mass presents for evaluation of UTI symptoms that began this morning.  These consist of a feeling of discomfort when urinating but she cannot describe it as burning.  Urinary urgency and frequency.  She also endorses some suprapubic abdominal pain and low back pain.  She denies fever, nausea, vomiting, blood in her urine, or cloudiness to her urine.  She is currently on her menstrual cycle.  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/11/2022    Patient Active Problem List   Diagnosis Date Noted   Migraine without aura and with status migrainosus, not intractable 03/05/2023   Class 1 obesity due to excess calories with serious comorbidity and body mass index (BMI) of 34.0 to 34.9 in adult 08/28/2022   Screen for colon cancer 06/02/2022   Encounter for weight management 03/14/2022   Annual physical exam 03/14/2022   Primary hypertension 03/14/2022   Abnormal mammogram of left breast 04/14/2016    Past Surgical History:  Procedure Laterality Date   COLONOSCOPY WITH PROPOFOL N/A 06/02/2022   Procedure: COLONOSCOPY WITH PROPOFOL;  Surgeon: Midge Minium, MD;  Location: [facility];  Service: Endoscopy;  Laterality: N/A;   DILATION AND CURETTAGE OF UTERUS  2000    OB History     Gravida  5   Para  3   Term  2   Preterm  1   AB  2   Living  3      SAB  2   IAB       Ectopic      Multiple      Live Births  3            Home Medications    Prior to Admission medications   Medication Sig Start Date End Date Taking? Authorizing Provider  buPROPion (WELLBUTRIN SR) 150 MG 12 hr tablet Take 150 mg PO once daily for 3 days then increase to 150 mg PO twice daily. 03/05/23  Yes Jerrol Banana, MD  nitrofurantoin, macrocrystal-monohydrate, (MACROBID) 100 MG capsule Take 1 capsule (100 mg total) by mouth 2 (two) times daily. 05/25/23  Yes Becky Augusta, NP  phenazopyridine (PYRIDIUM) 200 MG tablet Take 1 tablet (200 mg total) by mouth 3 (three) times daily. 05/25/23  Yes Becky Augusta, NP  WEGOVY 0.25 MG/0.5ML SOAJ Inject 0.25 mg into the skin once a week. Use this dose for 1 month (4 shots) and then increase to next higher dose. 03/05/23  Yes Jerrol Banana, MD  SUMAtriptan (IMITREX) 50 MG tablet Take 1 tablet (50 mg total) by mouth every 2 (two) hours as needed for migraine. May repeat in 2 hours if headache persists or recurs. 03/05/23   Jerrol Banana, MD  albuterol (PROVENTIL HFA;VENTOLIN HFA) 108 (90 Base) MCG/ACT inhaler Inhale 2 puffs into the lungs every 4 (four)  hours as needed for wheezing. 04/29/18 06/09/19  Renford Dills, NP  medroxyPROGESTERone (PROVERA) 10 MG tablet Take 2 tablets (20 mg total) by mouth 3 (three) times daily for 7 days. 01/06/19 06/09/19  Conard Novak, MD    Family History Family History  Problem Relation Age of Onset   Drug abuse Mother    Heart disease Mother    Other Father        unknown medical history   Breast cancer Other        mat great aunt    Social History Social History   Tobacco Use   Smoking status: Never   Smokeless tobacco: Never  Vaping Use   Vaping status: Never Used  Substance Use Topics   Alcohol use: Not Currently    Comment: rarely   Drug use: No     Allergies   Patient has no known allergies.   Review of Systems Review of Systems  Constitutional:  Negative for fever.   Gastrointestinal:  Positive for abdominal pain. Negative for nausea and vomiting.  Genitourinary:  Positive for dysuria, frequency and urgency. Negative for hematuria.  Musculoskeletal:  Positive for back pain.     Physical Exam Triage Vital Signs ED Triage Vitals  Encounter Vitals Group     BP 05/25/23 1815 (!) 112/91     Systolic BP Percentile --      Diastolic BP Percentile --      Pulse Rate 05/25/23 1815 88     Resp 05/25/23 1815 15     Temp 05/25/23 1815 98.2 F (36.8 C)     Temp Source 05/25/23 1815 Oral     SpO2 05/25/23 1815 100 %     Weight 05/25/23 1813 211 lb (95.7 kg)     Height 05/25/23 1813 5\' 4"  (1.626 m)     Head Circumference --      Peak Flow --      Pain Score 05/25/23 1813 8     Pain Loc --      Pain Education --      Exclude from Growth Chart --    No data found.  Updated Vital Signs BP (!) 112/91 (BP Location: Left Arm)   Pulse 88   Temp 98.2 F (36.8 C) (Oral)   Resp 15   Ht 5\' 4"  (1.626 m)   Wt 211 lb (95.7 kg)   LMP 05/24/2023 (Exact Date)   SpO2 100%   BMI 36.22 kg/m   Visual Acuity Right Eye Distance:   Left Eye Distance:   Bilateral Distance:    Right Eye Near:   Left Eye Near:    Bilateral Near:     Physical Exam Vitals and nursing note reviewed.  Constitutional:      Appearance: Normal appearance. She is not ill-appearing.  HENT:     Head: Normocephalic and atraumatic.  Cardiovascular:     Rate and Rhythm: Normal rate and regular rhythm.     Pulses: Normal pulses.     Heart sounds: Normal heart sounds. No murmur heard.    No friction rub. No gallop.  Pulmonary:     Effort: Pulmonary effort is normal.     Breath sounds: Normal breath sounds. No wheezing, rhonchi or rales.  Abdominal:     General: Abdomen is flat.     Palpations: Abdomen is soft.     Tenderness: There is abdominal tenderness. There is no right CVA tenderness, left CVA tenderness, guarding or rebound.  Comments: Mild suprapubic tenderness.   Skin:    General: Skin is warm and dry.     Capillary Refill: Capillary refill takes less than 2 seconds.     Findings: No rash.  Neurological:     General: No focal deficit present.     Mental Status: She is alert and oriented to person, place, and time.      UC Treatments / Results  Labs (all labs ordered are listed, but only abnormal results are displayed) Labs Reviewed  URINALYSIS, W/ REFLEX TO CULTURE (INFECTION SUSPECTED) - Abnormal; Notable for the following components:      Result Value   APPearance HAZY (*)    Specific Gravity, Urine >1.030 (*)    Hgb urine dipstick MODERATE (*)    Protein, ur 30 (*)    Bacteria, UA MANY (*)    All other components within normal limits  URINE CULTURE    EKG   Radiology No results found.  Procedures Procedures (including critical care time)  Medications Ordered in UC Medications - No data to display  Initial Impression / Assessment and Plan / UC Course  I have reviewed the triage vital signs and the nursing notes.  Pertinent labs & imaging results that were available during my care of the patient were reviewed by me and considered in my medical decision making (see chart for details).   Patient is a pleasant, nontoxic-appearing 47 year old female presenting for evaluation of UTI symptoms as outlined HPI above.  Her symptoms began this morning.  Her abdomen is soft, flat, with mild suprapubic tenderness but no guarding or rebound.  No CVA tenderness on exam.  I will order urinalysis to evaluate for the presence of UTI.  Urinalysis shows hazy appearance with a high specific gravity, moderate hemoglobin, 30 protein, but negative for nitrites or leukocyte esterase.  Reflex microscopy shows 11-20 WBCs with 21-50 RBCs and many back area.  Urine will reflex to culture.  I will discharge patient home with a diagnosis of UTI and start her on Macrobid 100 mg twice daily for 5 days along with Pyridium 200 mg every 8 hours as needed for  urinary discomfort.  I have advised her to increase her oral fluid intake so that she increases her urine production to help flush your urinary tract.  Return and ER precautions reviewed.   Final Clinical Impressions(s) / UC Diagnoses   Final diagnoses:  Lower urinary tract infectious disease     Discharge Instructions      Take the Macrobid twice daily for 5 days with food for treatment of urinary tract infection.  Use the Pyridium every 8 hours as needed for urinary discomfort.  This will turn your urine a bright red-orange.  Increase your oral fluid intake so that you increase your urine production and or flushing your urinary system.  Take an over-the-counter probiotic, such as Culturelle-Align-Activia, 1 hour after each dose of antibiotic to prevent diarrhea or yeast infections from forming.  We will culture urine and change the antibiotics if necessary.  Return for reevaluation, or see your primary care provider, for any new or worsening symptoms.      ED Prescriptions     Medication Sig Dispense Auth. Provider   nitrofurantoin, macrocrystal-monohydrate, (MACROBID) 100 MG capsule Take 1 capsule (100 mg total) by mouth 2 (two) times daily. 10 capsule Becky Augusta, NP   phenazopyridine (PYRIDIUM) 200 MG tablet Take 1 tablet (200 mg total) by mouth 3 (three) times daily. 6 tablet Becky Augusta,  NP      PDMP not reviewed this encounter.   Becky Augusta, NP 05/25/23 1905

## 2023-05-25 NOTE — ED Triage Notes (Signed)
Patient c/o lower back and abdominal pain and urinary urgency that started this morning.  Patient denies fevers.

## 2023-05-26 ENCOUNTER — Other Ambulatory Visit: Payer: Self-pay | Admitting: Family Medicine

## 2023-05-26 DIAGNOSIS — Z7689 Persons encountering health services in other specified circumstances: Secondary | ICD-10-CM

## 2023-05-27 LAB — URINE CULTURE

## 2023-05-31 NOTE — Telephone Encounter (Signed)
 Requested medication (s) are due for refill today: Yes  Requested medication (s) are on the active medication list: Yes  Last refill:  03/05/23  Future visit scheduled: Yes  Notes to clinic:  .Unable to refill per protocol due to failed labs, no updated results.      Requested Prescriptions  Pending Prescriptions Disp Refills   buPROPion  (WELLBUTRIN  SR) 150 MG 12 hr tablet [Pharmacy Med Name: BUPROPION  HCL SR 150 MG TABLET] 180 tablet 0    Sig: TAKE 1 TAB BY MOUTH ONCE DAILY FOR 3 DAYS THEN INCREASE TO 1 TAB TWICE DAILY.     Psychiatry: Antidepressants - bupropion  Failed - 05/31/2023 11:22 AM      Failed - Cr in normal range and within 360 days    Creat  Date Value Ref Range Status  11/29/2015 0.74 0.50 - 1.10 mg/dL Final   Creatinine, Ser  Date Value Ref Range Status  04/11/2022 0.87 0.57 - 1.00 mg/dL Final         Failed - AST in normal range and within 360 days    AST  Date Value Ref Range Status  04/11/2022 13 0 - 40 IU/L Final         Failed - ALT in normal range and within 360 days    ALT  Date Value Ref Range Status  04/11/2022 10 0 - 32 IU/L Final         Failed - Last BP in normal range    BP Readings from Last 1 Encounters:  05/25/23 (!) 112/91         Passed - Valid encounter within last 6 months    Recent Outpatient Visits           2 months ago Migraine without aura and with status migrainosus, not intractable   [place] [facility] at [facility], Selinda PARAS, MD   9 months ago Class 1 obesity due to excess calories with serious comorbidity and body mass index (BMI) of 34.0 to 34.9 in adult   [facility] at [facility], Selinda PARAS, MD   10 months ago Primary hypertension   [place] [facility] at [facility], Selinda PARAS, MD   11 months ago Primary hypertension    Primary Care & Sports Medicine at [facility], Selinda PARAS,  MD   11 months ago Nausea and vomiting, unspecified vomiting type   [facility] & [facility] Medicine at [facility], Selinda PARAS, MD       Future Appointments             In 1 week Ku, Selinda PARAS, MD [facility] Care & Sports Medicine at [facility], [facility]

## 2023-05-31 NOTE — Telephone Encounter (Signed)
 Please Review

## 2023-06-13 ENCOUNTER — Encounter: Payer: BC Managed Care – PPO | Admitting: Family Medicine

## 2023-06-18 ENCOUNTER — Other Ambulatory Visit: Payer: Self-pay | Admitting: Family Medicine

## 2023-06-18 DIAGNOSIS — Z7689 Persons encountering health services in other specified circumstances: Secondary | ICD-10-CM

## 2023-06-19 NOTE — Telephone Encounter (Signed)
Requested medications are due for refill today.  yes  Requested medications are on the active medications list.  yes  Last refill. 05/31/2023 #30 0 rf  Future visit scheduled.   yes  Notes to clinic.  Labs are expired.    Requested Prescriptions  Pending Prescriptions Disp Refills   buPROPion (WELLBUTRIN SR) 150 MG 12 hr tablet [Pharmacy Med Name: BUPROPION HCL SR 150 MG TABLET] 180 tablet 1    Sig: 1 TABLET TWICE A DAY     Psychiatry: Antidepressants - bupropion Failed - 06/19/2023  9:00 AM      Failed - Cr in normal range and within 360 days    Creat  Date Value Ref Range Status  11/29/2015 0.74 0.50 - 1.10 mg/dL Final   Creatinine, Ser  Date Value Ref Range Status  04/11/2022 0.87 0.57 - 1.00 mg/dL Final         Failed - AST in normal range and within 360 days    AST  Date Value Ref Range Status  04/11/2022 13 0 - 40 IU/L Final         Failed - ALT in normal range and within 360 days    ALT  Date Value Ref Range Status  04/11/2022 10 0 - 32 IU/L Final         Failed - Last BP in normal range    BP Readings from Last 1 Encounters:  05/25/23 (!) 112/91         Passed - Valid encounter within last 6 months    Recent Outpatient Visits           3 months ago Migraine without aura and with status migrainosus, not intractable   [place] [facility] at [facility]ty, Ocie Bob, MD   9 months ago Class 1 obesity due to excess calories with serious comorbidity and body mass index (BMI) of 34.0 to 34.9 in adult   [facility] at [facility], Ocie Bob, MD   11 months ago Primary hypertension   [place] [facility] at [facility], Ocie Bob, MD   12 months ago Primary hypertension   [place] [facility] at [facility], Ocie Bob, MD   1 year ago Nausea and vomiting, unspecified vomiting type   [facility]  Med[facility] at [facility], Ocie Bob, MD       Future Appointments             In 2 months [facility] Royalty, Ocie Bob, MD [facility] & [facility] Medicine at [facility], [facility]

## 2023-06-26 ENCOUNTER — Telehealth: Payer: 59 | Admitting: Family Medicine

## 2023-06-26 ENCOUNTER — Encounter: Payer: Self-pay | Admitting: Family Medicine

## 2023-06-26 VITALS — BP 134/78 | HR 111 | Ht 64.0 in | Wt 217.0 lb

## 2023-06-26 DIAGNOSIS — K529 Noninfective gastroenteritis and colitis, unspecified: Secondary | ICD-10-CM | POA: Diagnosis not present

## 2023-06-26 DIAGNOSIS — J069 Acute upper respiratory infection, unspecified: Secondary | ICD-10-CM | POA: Diagnosis not present

## 2023-06-26 DIAGNOSIS — R69 Illness, unspecified: Secondary | ICD-10-CM

## 2023-06-26 DIAGNOSIS — B349 Viral infection, unspecified: Secondary | ICD-10-CM | POA: Insufficient documentation

## 2023-06-26 LAB — POCT INFLUENZA A/B
Influenza A, POC: NEGATIVE
Influenza B, POC: NEGATIVE

## 2023-06-26 LAB — POC COVID19 BINAXNOW: SARS Coronavirus 2 Ag: NEGATIVE

## 2023-06-26 MED ORDER — ONDANSETRON 4 MG PO TBDP
4.0000 mg | ORAL_TABLET | Freq: Four times a day (QID) | ORAL | 0 refills | Status: AC | PRN
Start: 2023-06-26 — End: ?

## 2023-06-26 NOTE — Assessment & Plan Note (Signed)
History of Present Illness The patient presents with gastrointestinal symptoms and headache.  She began experiencing gastrointestinal symptoms on Thursday, including nausea, an upset stomach, and loose stools. No vomiting has occurred, but she feels queasy. By Monday, she developed a sore throat and persistent diarrhea. No significant cough, congestion, or shortness of breath. No mucus in stools.  Her headache started on Friday, accompanied by aches in her arms and under her shoulder blades. She took her prescribed migraine medication, sumatriptan, which did not alleviate the headache. Despite taking Tylenol every six hours and using DayQuil and NyQuil, her headache persists.  Her son, aged 30, experienced similar symptoms, including vomiting, diarrhea, a severe headache, and body aches, starting the weekend of [employer] Day. He works from home and was able to return to work by the following Tuesday, although he was still sluggish.  She recently traveled to [place], [place] for a conference.  Physical Exam VITALS: P- 111, SaO2- 98% HEENT: Oropharynx benign, TMs and canals benign bilaterally. Nasal passages without ertythema. No sinus tenderness on palpation throughout. NECK: No lymphadenopathy. CHEST: Lungs clear to auscultation. No WRR.  Assessment and Plan Viral Illness - URI - GE Symptoms of nausea, diarrhea, headache, and fatigue. No respiratory symptoms. Recent exposure to a family member with similar symptoms. No signs of bacterial infection on physical examination. - Prescribe Zofran for nausea as needed. - Advise on supportive care including rest, hydration, and a diet of easily digestible foods. - Hold Wegovy injection for this week. - Reevaluate if symptoms do not improve by Thursday or Friday.  Headache Persistent despite sumatriptan and Tylenol use. Likely related to the viral illness. - Continue Tylenol as needed. Can take with ibuprofen for enhanced pain control.  Watch stomach symptoms while on ibuprofen and stop if stomach issues worsen - Reevaluate if headache does not improve with resolution of the viral illness.  General Health Maintenance / Followup Plans - Provide work excuse note for the remainder of the week. - Plan for patient to message doctor by Thursday or Friday for symptom reevaluation.

## 2023-06-26 NOTE — Patient Instructions (Addendum)
Assessment and Plan  - Viral Illness - Upper Respiratory Infection (URI) vs Gastroenteritis (GE):   - Symptoms include nausea, diarrhea, headache, and fatigue, with no respiratory symptoms.   - Recent exposure to a family member with similar symptoms and no signs of bacterial infection.   - Prescribe Zofran for nausea as needed.   - Advise supportive care: rest, hydration, and a diet of easily digestible foods.   - Hold Wegovy injection for this week.   - Reevaluate if symptoms do not improve by Thursday or Friday.  - Headache:   - Persistent despite sumatriptan and Tylenol use, likely related to the viral illness.   - Continue Tylenol as needed and consider adding ibuprofen for enhanced pain control.   - Monitor for stomach symptoms while using ibuprofen and discontinue if stomach issues worsen.   - Reevaluate if the headache does not improve with the resolution of the viral illness.  - General Health Maintenance / Follow-up Plans:   - Provide a work excuse note for the remainder of the week.   - Plan for the patient to message the doctor by Thursday or Friday for symptom reevaluation.  Please follow these guidelines and contact us with any questions or concerns.

## 2023-06-26 NOTE — Progress Notes (Signed)
Primary Care / Sports Medicine Office Visit  Patient Information:  Patient ID: Jenna Price, female DOB: 11/25/1975 Age: 48 y.o. MRN: [id number]   Jenna Price is a pleasant 48 y.o. female presenting with the following:  Chief Complaint  Patient presents with   Sore Throat    Sore throat started yesterday 06/25/23    Headache     Woke up Friday 06/22/23 with headache , took migraine medication and did not help. Was exposed to some type of cold from her son but he did not go to the doctor so they are not aware of what he had.    Nausea    Nausea began on Friday 06/22/23.     Vitals:   06/26/23 1335  BP: 134/78  Pulse: (!) 111  SpO2: 98%   Vitals:   06/26/23 1335  Weight: 217 lb (98.4 kg)  Height: 5\' 4"  (1.626 m)   Body mass index is 37.25 kg/m.  No results found.   Independent interpretation of notes and tests performed by another provider:   None  Procedures performed:   None  Pertinent History, Exam, Impression, and Recommendations:   Problem List Items Addressed This Visit     Viral illness - Primary   History of Present Illness The patient presents with gastrointestinal symptoms and headache.  She began experiencing gastrointestinal symptoms on Thursday, including nausea, an upset stomach, and loose stools. No vomiting has occurred, but she feels queasy. By Monday, she developed a sore throat and persistent diarrhea. No significant cough, congestion, or shortness of breath. No mucus in stools.  Her headache started on Friday, accompanied by aches in her arms and under her shoulder blades. She took her prescribed migraine medication, sumatriptan, which did not alleviate the headache. Despite taking Tylenol every six hours and using DayQuil and NyQuil, her headache persists.  Her son, aged 13, experienced similar symptoms, including vomiting, diarrhea, a severe headache, and body aches, starting the weekend of [employer] Day. He works from home  and was able to return to work by the following Tuesday, although he was still sluggish.  She recently traveled to [place]. for a conference.  Physical Exam VITALS: P- 111, SaO2- 98% HEENT: Oropharynx benign, TMs and canals benign bilaterally. Nasal passages without ertythema. No sinus tenderness on palpation throughout. NECK: No lymphadenopathy. CHEST: Lungs clear to auscultation. No WRR.  Assessment and Plan Viral Illness - URI - GE Symptoms of nausea, diarrhea, headache, and fatigue. No respiratory symptoms. Recent exposure to a family member with similar symptoms. No signs of bacterial infection on physical examination. - Prescribe Zofran for nausea as needed. - Advise on supportive care including rest, hydration, and a diet of easily digestible foods. - Hold Wegovy injection for this week. - Reevaluate if symptoms do not improve by Thursday or Friday.  Headache Persistent despite sumatriptan and Tylenol use. Likely related to the viral illness. - Continue Tylenol as needed. Can take with ibuprofen for enhanced pain control. Watch stomach symptoms while on ibuprofen and stop if stomach issues worsen - Reevaluate if headache does not improve with resolution of the viral illness.  General Health Maintenance / Followup Plans - Provide work excuse note for the remainder of the week. - Plan for patient to message doctor by Thursday or Friday for symptom reevaluation.      Relevant Medications   ondansetron (ZOFRAN-ODT) 4 MG disintegrating tablet   Other Visit Diagnoses       Sick  Relevant Orders   POC COVID-19 (Completed)   POCT Influenza A/B (Completed)        Orders & Medications Medications:  Meds ordered this encounter  Medications   ondansetron (ZOFRAN-ODT) 4 MG disintegrating tablet    Sig: Take 1 tablet (4 mg total) by mouth every 6 (six) hours as needed for nausea or vomiting.    Dispense:  20 tablet    Refill:  0   Orders Placed This Encounter   Procedures   POC COVID-19   POCT Influenza A/B     Return if symptoms worsen or fail to improve.     Jerrol Banana, MD, [facility]   Primary Care Sports Medicine Primary Care and Sports Medicine at [facility]

## 2023-07-16 ENCOUNTER — Encounter (INDEPENDENT_AMBULATORY_CARE_PROVIDER_SITE_OTHER): Payer: Self-pay | Admitting: Family Medicine

## 2023-07-16 DIAGNOSIS — E6609 Other obesity due to excess calories: Secondary | ICD-10-CM

## 2023-07-16 DIAGNOSIS — E66811 Obesity, class 1: Secondary | ICD-10-CM

## 2023-07-16 DIAGNOSIS — Z6834 Body mass index (BMI) 34.0-34.9, adult: Secondary | ICD-10-CM

## 2023-07-16 NOTE — Telephone Encounter (Signed)
 Please review.  KP

## 2023-08-09 NOTE — Telephone Encounter (Signed)
 Please review.  KP

## 2023-08-13 NOTE — Telephone Encounter (Signed)
 Please review.  KP

## 2023-08-17 ENCOUNTER — Other Ambulatory Visit: Payer: Self-pay | Admitting: Family Medicine

## 2023-08-17 MED ORDER — TIRZEPATIDE 15 MG/0.5ML ~~LOC~~ SOAJ: 15.0000 mg | SUBCUTANEOUS | 3 refills | Status: DC

## 2023-08-31 ENCOUNTER — Encounter: Payer: Self-pay | Admitting: Family Medicine

## 2023-09-11 NOTE — Telephone Encounter (Signed)
 Please review.  KP

## 2023-09-14 MED ORDER — PHENTERMINE HCL 15 MG PO CAPS: 15.0000 mg | ORAL_CAPSULE | ORAL | 0 refills | Status: DC

## 2023-09-14 NOTE — Telephone Encounter (Signed)
Please see the MyChart message reply(ies) for my assessment and plan.    This patient gave consent for this Medical Advice Message and is aware that it may result in a bill to [employer], as well as the possibility of receiving a bill for a co-payment or deductible. They are an established patient, but are not seeking medical advice exclusively about a problem treated during an in person or video visit in the last seven days. I did not recommend an in person or video visit within seven days of my reply.    I spent a total of 23 minutes cumulative time within 7 days through [employer].  Jerrol Banana, MD

## 2023-12-26 ENCOUNTER — Encounter: Payer: Self-pay | Admitting: Family Medicine

## 2023-12-26 NOTE — Telephone Encounter (Signed)
 Please review and advise if you are okay with this plan or if this is not something you are willing to do. Thank you  Osmond Steckman

## 2023-12-27 ENCOUNTER — Ambulatory Visit: Payer: Self-pay | Admitting: Family Medicine

## 2023-12-27 ENCOUNTER — Encounter: Payer: Self-pay | Admitting: Family Medicine

## 2023-12-27 VITALS — BP 120/80 | HR 88 | Ht 64.0 in | Wt 234.8 lb

## 2023-12-27 DIAGNOSIS — E6609 Other obesity due to excess calories: Secondary | ICD-10-CM | POA: Diagnosis not present

## 2023-12-27 MED ORDER — PHENTERMINE HCL 37.5 MG PO TABS: 37.5000 mg | ORAL_TABLET | Freq: Every day | ORAL | 2 refills | Status: DC

## 2023-12-27 NOTE — Assessment & Plan Note (Signed)
 History of Present Illness Jenna Price is a 48 year old female who presents for weight management follow-up.  Weight fluctuation and management - Weight has fluctuated over the years, with significant weight loss from 230 lbs to 170 lbs in 2021 while on phentermine  and actively exercising - Regained weight after discontinuing Wegovy , with current weight similar to prior maximum - Recent weight gain attributed to cessation of anti-obesity medications and decreased exercise  Pharmacologic interventions for weight loss - Phentermine : Initially started at 15 mg once daily, increased to 30 mg after two weeks due to insufficient effect - Discontinued phentermine  two to three weeks ago after running out of medication - Phentermine  suppresses appetite, increases thirst, and provides energy for exercise - Wegovy : Previously used but discontinued due to significant nausea - Tirzepatide : Attempted but not covered by insurance, leading to discontinuation - No use of Wegovy  or tirzepatide  for approximately eight months - Bupropion  and topiramate : Previously used for weight management, not taken with phentermine  this time; two bottles remain at home  Exercise and lifestyle modification - Previously achieved weight loss with regular gym attendance - Recent job change has impacted ability to maintain consistent exercise routine - New job is 100% remote, with hope to better balance work and exercise - Maintains a gym membership but has not attended regularly due to prior job demands  Lifestyle modifications were reviewed and reinforced at today's visit. This includes:  - Recent weight check: Most recent weight documented within the last 45 days:  Filed Weights   12/27/23 0914  Weight: 234 lb 12.8 oz (106.5 kg)   Weight trends are being monitored to assess response to current interventions.  - Dietary recommendations: Patient counseled on adopting a nutritionally balanced, calorie-appropriate diet  focused on whole foods. Recommendations include increasing intake of vegetables, fruits, lean proteins, and whole grains, while reducing consumption of added sugars, refined carbohydrates, saturated fats, and highly processed foods. Portion control and mindful eating habits were discussed. Patient encouraged to make gradual, sustainable dietary changes in alignment with health goals. Current dietary focus is: Picking 1 item to limit (soda, snack, etc.)  - Exercise plan: Patient was counseled on the benefits of regular physical activity and encouraged to engage in a sustainable and enjoyable exercise routine. Options discussed include aerobic activities (e.g., walking, swimming, cycling), resistance training, and flexibility or balance exercises. Goal is to gradually increase physical activity toward at least 150 minutes per week of moderate-intensity activity, as tolerated. Patient will choose preferred form(s) of exercise that align with individual capabilities, interests, and schedule. Plan to reassess activity level and adjust as needed. Current exercise plan is: Focus on 1 day of moving the body weekly  - Behavioral reinforcement: Emphasis was placed on the ongoing importance of lifestyle modifications in conjunction with pharmacologic therapy. Patient expressed willingness to continue with dietary, physical activity, and behavioral strategies. Barriers patient is working on: Work from home  Plan to reassess weight, dietary adherence, physical activity, and healthy behaviors at follow-up.   Assessment and Plan Obesity Obesity with weight regain after stopping Wegovy  and phentermine . Tolerated low-dose for 15 mg phentermine  and 30 mg dose. Discussed sustainable lifestyle changes and potential medication combinations. - Prescribe phentermine  37.5 mg daily on an empty stomach in the morning. - Restart bupropion  150 mg daily for 7 days, then increase to 150 mg twice daily. - Start topiramate  daily,  timing flexible. - Encourage one sustainable dietary change, such as reducing one snack or soda per day. - Encourage at  least one day per week of physical activity, aiming for at least 10 minutes, working towards 30 minutes. - Provide information on 'set point' below. - Schedule follow-up visit in 3 months.

## 2023-12-27 NOTE — Progress Notes (Signed)
 Primary Care / Sports Medicine Office Visit  Patient Information:  Patient ID: Jenna Price, female DOB: Jan 08, 1976 Age: 48 y.o. MRN: [id number]   Jenna Price is a pleasant 48 y.o. female presenting with the following:  Chief Complaint  Patient presents with   Weight Check    Weight check and refill. Dosage increase.     Vitals:   12/27/23 0914  BP: 120/80  Pulse: 88  SpO2: 97%   Vitals:   12/27/23 0914  Weight: 234 lb 12.8 oz (106.5 kg)  Height: 5' 4 (1.626 m)   Body mass index is 40.3 kg/m.  No results found.   Independent interpretation of notes and tests performed by another provider:   None  Procedures performed:   None  Pertinent History, Exam, Impression, and Recommendations:   Problem List Items Addressed This Visit     Obesity - Primary   History of Present Illness Jenna Price is a 48 year old female who presents for weight management follow-up.  Weight fluctuation and management - Weight has fluctuated over the years, with significant weight loss from 230 lbs to 170 lbs in 2021 while on phentermine  and actively exercising - Regained weight after discontinuing Wegovy , with current weight similar to prior maximum - Recent weight gain attributed to cessation of anti-obesity medications and decreased exercise  Pharmacologic interventions for weight loss - Phentermine : Initially started at 15 mg once daily, increased to 30 mg after two weeks due to insufficient effect - Discontinued phentermine  two to three weeks ago after running out of medication - Phentermine  suppresses appetite, increases thirst, and provides energy for exercise - Wegovy : Previously used but discontinued due to significant nausea - Tirzepatide : Attempted but not covered by insurance, leading to discontinuation - No use of Wegovy  or tirzepatide  for approximately eight months - Bupropion  and topiramate : Previously used for weight management, not taken with phentermine  this  time; two bottles remain at home  Exercise and lifestyle modification - Previously achieved weight loss with regular gym attendance - Recent job change has impacted ability to maintain consistent exercise routine - New job is 100% remote, with hope to better balance work and exercise - Maintains a gym membership but has not attended regularly due to prior job demands  Lifestyle modifications were reviewed and reinforced at today's visit. This includes:  - Recent weight check: Most recent weight documented within the last 45 days:  Filed Weights   12/27/23 0914  Weight: 234 lb 12.8 oz (106.5 kg)   Weight trends are being monitored to assess response to current interventions.  - Dietary recommendations: Patient counseled on adopting a nutritionally balanced, calorie-appropriate diet focused on whole foods. Recommendations include increasing intake of vegetables, fruits, lean proteins, and whole grains, while reducing consumption of added sugars, refined carbohydrates, saturated fats, and highly processed foods. Portion control and mindful eating habits were discussed. Patient encouraged to make gradual, sustainable dietary changes in alignment with health goals. Current dietary focus is: Picking 1 item to limit (soda, snack, etc.)  - Exercise plan: Patient was counseled on the benefits of regular physical activity and encouraged to engage in a sustainable and enjoyable exercise routine. Options discussed include aerobic activities (e.g., walking, swimming, cycling), resistance training, and flexibility or balance exercises. Goal is to gradually increase physical activity toward at least 150 minutes per week of moderate-intensity activity, as tolerated. Patient will choose preferred form(s) of exercise that align with individual capabilities, interests, and schedule. Plan to reassess activity  level and adjust as needed. Current exercise plan is: Focus on 1 day of moving the body weekly  -  Behavioral reinforcement: Emphasis was placed on the ongoing importance of lifestyle modifications in conjunction with pharmacologic therapy. Patient expressed willingness to continue with dietary, physical activity, and behavioral strategies. Barriers patient is working on: Work from home  Plan to reassess weight, dietary adherence, physical activity, and healthy behaviors at follow-up.   Assessment and Plan Obesity Obesity with weight regain after stopping Wegovy  and phentermine . Tolerated low-dose for 15 mg phentermine  and 30 mg dose. Discussed sustainable lifestyle changes and potential medication combinations. - Prescribe phentermine  37.5 mg daily on an empty stomach in the morning. - Restart bupropion  150 mg daily for 7 days, then increase to 150 mg twice daily. - Start topiramate  daily, timing flexible. - Encourage one sustainable dietary change, such as reducing one snack or soda per day. - Encourage at least one day per week of physical activity, aiming for at least 10 minutes, working towards 30 minutes. - Provide information on 'set point' below. - Schedule follow-up visit in 3 months.      Relevant Medications   phentermine  (ADIPEX-P ) 37.5 MG tablet     Orders & Medications Medications:  Meds ordered this encounter  Medications   phentermine  (ADIPEX-P ) 37.5 MG tablet    Sig: Take 1 tablet (37.5 mg total) by mouth daily before breakfast.    Dispense:  30 tablet    Refill:  2   No orders of the defined types were placed in this encounter.    Return in about 3 months (around 03/28/2024) for Weight MGMT f/u.     Selinda JINNY Ku, MD, [facility]   Primary Care Sports Medicine Primary Care and Sports Medicine at [facility]

## 2023-12-27 NOTE — Patient Instructions (Signed)
 Patient Plan for Post-Visit Guidance  - Take phentermine  37.5 mg daily on an empty stomach in the morning. - Restart bupropion  150 mg daily for 7 days, then increase to 150 mg twice daily. - Start topiramate  daily; you may take it at any time of day. - Make one sustainable dietary change, such as reducing one snack or soda per day. - Aim for at least one day per week of physical activity, starting with at least 10 minutes and working towards 30 minutes. - Review the information provided on weight 'set point.' - Schedule and attend a follow-up visit in 3 months.  Red Flags: - If you experience chest pain, shortness of breath, severe headache, mood changes, or any new or concerning symptoms, contact the office immediately.

## 2023-12-28 ENCOUNTER — Ambulatory Visit: Payer: Self-pay | Admitting: Family Medicine

## 2023-12-29 ENCOUNTER — Encounter: Payer: Self-pay | Admitting: Family Medicine

## 2023-12-31 NOTE — Telephone Encounter (Signed)
 Please review and advise patient.   JM

## 2024-01-07 NOTE — Telephone Encounter (Signed)
 Please view and advise.  JM

## 2024-01-09 ENCOUNTER — Other Ambulatory Visit: Payer: Self-pay

## 2024-01-09 DIAGNOSIS — E66811 Obesity, class 1: Secondary | ICD-10-CM

## 2024-01-09 DIAGNOSIS — E6609 Other obesity due to excess calories: Secondary | ICD-10-CM

## 2024-01-09 MED ORDER — TIRZEPATIDE-WEIGHT MANAGEMENT 2.5 MG/0.5ML ~~LOC~~ SOLN: 2.5000 mg | SUBCUTANEOUS | 0 refills | Status: DC

## 2024-01-09 NOTE — Telephone Encounter (Signed)
 Hey are you sure because I checked her chart and you just sent phentermine  in for her on 12/27/23

## 2024-04-03 ENCOUNTER — Ambulatory Visit (INDEPENDENT_AMBULATORY_CARE_PROVIDER_SITE_OTHER): Payer: Self-pay | Admitting: Family Medicine

## 2024-04-03 ENCOUNTER — Encounter: Payer: Self-pay | Admitting: Family Medicine

## 2024-04-03 VITALS — BP 110/72 | HR 109 | Temp 98.3°F | Ht 64.0 in | Wt 224.0 lb

## 2024-04-03 DIAGNOSIS — J019 Acute sinusitis, unspecified: Secondary | ICD-10-CM | POA: Insufficient documentation

## 2024-04-03 MED ORDER — PROMETHAZINE-DM 6.25-15 MG/5ML PO SYRP: 5.0000 mL | ORAL_SOLUTION | Freq: Four times a day (QID) | ORAL | 0 refills | Status: DC | PRN

## 2024-04-03 MED ORDER — AZITHROMYCIN 250 MG PO TABS: ORAL_TABLET | ORAL | 0 refills | Status: AC

## 2024-04-08 NOTE — Assessment & Plan Note (Signed)
 History of Present Illness Jenna Price is a 48 year old female who presents with cough and chest congestion.  Cough and chest congestion - Onset Monday with sore throat, no cough initially - Sparse cough developed Tuesday - Cough worsened significantly by Wednesday afternoon, persistent especially at night - Cough causes nocturnal awakening at 12:30 AM due to continuous coughing - Cough is now productive of phlegm, character of sputum not closely examined - Associated chest tightness and soreness from coughing - No fever or chills  Upper respiratory symptoms - Sore throat present on Monday, resolved by Tuesday - Pressure around eyes and watery eyes - No pressure around forehead, nasal bridge, or cheeks  Musculoskeletal symptoms - Woke up with achy joints on Sunday night, resolved by Monday morning  Environmental and allergen exposure - Walks in the evenings and cuts grass in neighborhood, possible allergen exposure - No recent illness in household - Suspects possible exposure while shopping over the weekend  Symptom management - Using Mucinex  since Monday - Taking Emergen-C since Monday  Physical Exam VITALS: SaO2- 96% HEENT: Tympanic membranes and ear canals benign bilaterally. Left turbinate with erythema and swelling, right turbinate with mild erythema. Tenderness at bilateral maxillary sinuses, other sinuses non-tender. Oropharynx benign. NECK: Left posterior cervical chain lymphadenopathy. CHEST: Lungs clear to auscultation bilaterally, no wheezes, rales, rhonchi.  Results LABS Oxygen saturation: 96% (04/03/2024)  Assessment and Plan Acute sinusitis with cough and postnasal drip Acute sinusitis, viral vs bacterial etiology discussed. Antibiotics prescribed as precaution. Discussed potential bacterial infection if symptoms persist or worsen. - Prescribed Flonase for nasal congestion. - Continue Mucinex  to thin mucus. - Prescribed antihistamine to reduce mucus  production. - Prescribed prescription-grade cough medicine for nighttime use. - Prescribed Z-Pak (azithromycin) if symptoms do not improve by Tuesday. - Advised deep breathing exercises. - Recommended rest and working from home if possible.  Allergic rhinitis Contributing to nasal congestion and postnasal drip. Inflammation indicated by erythema and swelling of turbinates. - Prescribed Flonase for nasal congestion. - Recommended over-the-counter antihistamines.

## 2024-04-08 NOTE — Progress Notes (Signed)
 Primary Care / Sports Medicine Office Visit  Patient Information:  Patient ID: Jenna Price, female DOB: Dec 13, 1975 Age: 48 y.o. MRN: [id number]   Jenna Price is a pleasant 48 y.o. female presenting with the following:  Chief Complaint  Patient presents with   Cough    Cough and congestion in chest since yesterday. Mucinex  DM since Monday. No fever    Vitals:   04/03/24 0803  BP: 110/72  Pulse: (!) 109  Temp: 98.3 F (36.8 C)  SpO2: 96%   Vitals:   04/03/24 0803  Weight: 224 lb (101.6 kg)  Height: 5' 4 (1.626 m)   Body mass index is 38.45 kg/m.  No results found.   Discussed the use of AI scribe software for clinical note transcription with the patient, who gave verbal consent to proceed.   Independent interpretation of notes and tests performed by another provider:   None  Procedures performed:   None  Pertinent History, Exam, Impression, and Recommendations:   Problem List Items Addressed This Visit     Acute rhinosinusitis - Primary   History of Present Illness Jenna Price is a 48 year old female who presents with cough and chest congestion.  Cough and chest congestion - Onset Monday with sore throat, no cough initially - Sparse cough developed Tuesday - Cough worsened significantly by Wednesday afternoon, persistent especially at night - Cough causes nocturnal awakening at 12:30 AM due to continuous coughing - Cough is now productive of phlegm, character of sputum not closely examined - Associated chest tightness and soreness from coughing - No fever or chills  Upper respiratory symptoms - Sore throat present on Monday, resolved by Tuesday - Pressure around eyes and watery eyes - No pressure around forehead, nasal bridge, or cheeks  Musculoskeletal symptoms - Woke up with achy joints on Sunday night, resolved by Monday morning  Environmental and allergen exposure - Walks in the evenings and cuts grass in neighborhood, possible  allergen exposure - No recent illness in household - Suspects possible exposure while shopping over the weekend  Symptom management - Using Mucinex  since Monday - Taking Emergen-C since Monday  Physical Exam VITALS: SaO2- 96% HEENT: Tympanic membranes and ear canals benign bilaterally. Left turbinate with erythema and swelling, right turbinate with mild erythema. Tenderness at bilateral maxillary sinuses, other sinuses non-tender. Oropharynx benign. NECK: Left posterior cervical chain lymphadenopathy. CHEST: Lungs clear to auscultation bilaterally, no wheezes, rales, rhonchi.  Results LABS Oxygen saturation: 96% (04/03/2024)  Assessment and Plan Acute sinusitis with cough and postnasal drip Acute sinusitis, viral vs bacterial etiology discussed. Antibiotics prescribed as precaution. Discussed potential bacterial infection if symptoms persist or worsen. - Prescribed Flonase for nasal congestion. - Continue Mucinex  to thin mucus. - Prescribed antihistamine to reduce mucus production. - Prescribed prescription-grade cough medicine for nighttime use. - Prescribed Z-Pak (azithromycin) if symptoms do not improve by Tuesday. - Advised deep breathing exercises. - Recommended rest and working from home if possible.  Allergic rhinitis Contributing to nasal congestion and postnasal drip. Inflammation indicated by erythema and swelling of turbinates. - Prescribed Flonase for nasal congestion. - Recommended over-the-counter antihistamines.      Relevant Medications   promethazine-dextromethorphan (PROMETHAZINE-DM) 6.25-15 MG/5ML syrup   azithromycin (ZITHROMAX) 250 MG tablet     Orders & Medications Medications:  Meds ordered this encounter  Medications   promethazine-dextromethorphan (PROMETHAZINE-DM) 6.25-15 MG/5ML syrup    Sig: Take 5 mLs by mouth 4 (four) times daily as needed for cough.  Dispense:  118 mL    Refill:  0   azithromycin (ZITHROMAX) 250 MG tablet    Sig: Take  2 tablets on day 1, then 1 tablet daily on days 2 through 5    Dispense:  6 tablet    Refill:  0   No orders of the defined types were placed in this encounter.    No follow-ups on file.     Selinda JINNY Ku, MD, [facility]   Primary Care Sports Medicine Primary Care and Sports Medicine at [facility]

## 2024-04-08 NOTE — Patient Instructions (Signed)
 VISIT SUMMARY:  You came in today because of a cough and chest congestion that started earlier this week. We discussed your symptoms and possible causes, and I have provided a treatment plan to help you feel better.  YOUR PLAN:  ACUTE SINUSITIS WITH COUGH AND POSTNASAL DRIP:  -Use Flonase for nasal congestion. -Continue taking Mucinex  to thin mucus. -Take the prescribed antihistamine to reduce mucus production. -Use the prescription-grade cough medicine at night to help you sleep. -If your symptoms do not improve by Tuesday, start taking the Z-Pak (azithromycin). -Practice deep breathing exercises. -Get plenty of rest and try to work from home if possible.  ALLERGIC RHINITIS: Your nasal congestion and postnasal drip are also being affected by allergies. -Use Flonase for nasal congestion. -Take over-the-counter antihistamines as needed.

## 2024-06-03 ENCOUNTER — Encounter: Payer: Self-pay | Admitting: Family Medicine

## 2024-06-10 ENCOUNTER — Encounter: Payer: Self-pay | Admitting: Family Medicine

## 2024-06-10 ENCOUNTER — Ambulatory Visit: Payer: Self-pay | Admitting: Family Medicine

## 2024-06-10 VITALS — BP 130/82 | HR 108 | Ht 64.0 in | Wt 233.0 lb

## 2024-06-10 DIAGNOSIS — M25571 Pain in right ankle and joints of right foot: Secondary | ICD-10-CM | POA: Diagnosis not present

## 2024-06-10 DIAGNOSIS — Z0000 Encounter for general adult medical examination without abnormal findings: Secondary | ICD-10-CM | POA: Diagnosis not present

## 2024-06-10 DIAGNOSIS — I10 Essential (primary) hypertension: Secondary | ICD-10-CM | POA: Diagnosis not present

## 2024-06-10 DIAGNOSIS — G43001 Migraine without aura, not intractable, with status migrainosus: Secondary | ICD-10-CM

## 2024-06-10 DIAGNOSIS — M25572 Pain in left ankle and joints of left foot: Secondary | ICD-10-CM

## 2024-06-10 DIAGNOSIS — E6609 Other obesity due to excess calories: Secondary | ICD-10-CM

## 2024-06-10 MED ORDER — MELOXICAM 15 MG PO TABS: 15.0000 mg | ORAL_TABLET | Freq: Every day | ORAL | 0 refills | Status: AC | PRN

## 2024-06-10 NOTE — Assessment & Plan Note (Signed)
" °  Orders:   CBC   Comprehensive metabolic panel with GFR   Hemoglobin A1c   Lipid panel   TSH  "

## 2024-06-10 NOTE — Assessment & Plan Note (Signed)
 SABRA

## 2024-06-10 NOTE — Assessment & Plan Note (Signed)
 Jenna Price

## 2024-06-10 NOTE — Progress Notes (Signed)
 "    Annual Physical Exam Visit  Patient Information:  Patient ID: Jenna Price, female DOB: [birth year] Age: 49 y.o. MRN: [id number]   Subjective:   CC: Annual Physical Exam  HPI:  Jenna Price is here for their annual physical.  I reviewed the past medical history, family history, social history, surgical history, and allergies today and changes were made as necessary.  Please see the problem list section below for additional details.  Past Medical History: 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/11/2022   Past Surgical History: Past Surgical History:  Procedure Laterality Date   COLONOSCOPY WITH PROPOFOL  N/A 06/02/2022   Procedure: COLONOSCOPY WITH PROPOFOL ;  Surgeon: Jinny Carmine, MD;  Location: [facility];  Service: Endoscopy;  Laterality: N/A;   DILATION AND CURETTAGE OF UTERUS  2000   Family History: Family History  Problem Relation Age of Onset   Drug abuse Mother    Heart disease Mother    Other Father        unknown medical history   Breast cancer Other        mat great aunt   Allergies: Allergies[1] Health Maintenance: Health Maintenance  Topic Date Due   Hepatitis B Vaccines 19-59 Average Risk (1 of 3 - 19+ 3-dose series) Never done   Influenza Vaccine  08/26/2024 (Originally 12/28/2023)   COVID-19 Vaccine (1 - 2025-26 season) 03/30/2026 (Originally 01/28/2024)   Mammogram  05/03/2025   Cervical Cancer Screening (HPV/Pap Cotest)  03/22/2026   Colonoscopy  06/02/2032   Hepatitis C Screening  Completed   HIV Screening  Completed   Pneumococcal Vaccine  Aged Out   HPV VACCINES  Aged Out   Meningococcal B Vaccine  Aged Out   DTaP/Tdap/Td  Discontinued    HM Colonoscopy           Upcoming     Colonoscopy (Every 10 Years) Next due on 06/02/2032    06/02/2022  COLONOSCOPY  Only the first 1 history entries have been loaded, but more history exists.              Medications: Medications Ordered Prior to Encounter[2]  Discussed the use of AI scribe software for clinical note transcription with the patient, who gave verbal consent to proceed.   Objective:   Vitals:   06/10/24 0809  BP: 130/82  Pulse: (!) 108  SpO2: 98%   Vitals:   06/10/24 0809  Weight: 233 lb (105.7 kg)  Height: 5' 4 (1.626 m)   Body mass index is 39.99 kg/m.  General: Well Developed, well nourished, and in no acute distress.  Neuro: Alert and oriented x3, extra-ocular muscles intact, sensation grossly intact. Cranial nerves II through XII are grossly intact, motor, sensory, and coordinative functions are intact. HEENT: Normocephalic, atraumatic, neck supple, no masses, no lymphadenopathy, thyroid  nonenlarged. Oropharynx, nasopharynx, external ear canals are unremarkable. Skin: Warm and dry, no rashes noted.  Cardiac: Regular rate and rhythm, no murmurs rubs or gallops. No peripheral edema. Pulses symmetric. Respiratory: Clear to auscultation bilaterally. Speaking in full sentences.  Abdominal: Soft, nontender, nondistended, positive bowel sounds, no masses, no organomegaly. Musculoskeletal: Stable, and with full range of motion.  Impression and Recommendations:   The  patient was counselled, risk factors were discussed, and anticipatory guidance given.  Discussed the use of AI scribe software for clinical note transcription with the patient, who gave verbal consent to proceed.  History of Present Illness   Jenna Price is a 49 year old female with primary osteoarthritis of the left foot who presents for evaluation of progressive left foot pain.  Left Foot Pain and Bony Prominence: - Progressive pain localized to the dorsal aspect of the left foot over the past 4-6 weeks -  Enlarging bony prominence on the dorsal left foot - Pain exacerbated by ambulation and prolonged shoe wear - Maximal pain severity by the end of the day - Intermittent swelling around the ankle, particularly at night - No history of trauma or falls - Symptom onset coincided with starting a new job at the end of September, which required a change in footwear - No use of over-the-counter or prescription therapies for pain  Obesity and Weight Management: - History of obesity - Previously achieved 70-pound weight loss with semaglutide  - Discontinued semaglutide  due to intolerable side effects at higher doses and to avoid adverse effects during job transition - Interested in resuming pharmacologic weight management with oral semaglutide  - Actively engaging in lifestyle modifications, including increased physical activity, participation in a workplace water  challenge, and plans to walk regularly with her son - Prior use of phentermine  resulted in significant irritability; does not wish to repeat this therapy - Monitors for gastrointestinal side effects and uses Miralax and probiotics as needed  Migraine: - Experienced only one migraine since starting her new job, attributed to stress - Continues to have sumatriptan  available if needed  Medications: - Not currently taking any medications       Assessment and Plan    Arthralgia of bilateral feet - left > right Symptomatic TMT base of the left foot with bony prominence and pain, exacerbated by shoe wear and at day's end, impacting daily activities. Conservative management is appropriate. - Recommended topical diclofenac gel up to four times daily as needed. - Prescribed oral meloxicam  if topical treatment is insufficient. - Advised on shoe modifications and provided information on professional shoe fitting services. - Discussed potential use of orthotic inserts if symptoms persist. - Instructed her to monitor for symptom progression and  report worsening pain.  Obesity Chronic obesity with prior 70-pound weight loss on semaglutide , discontinued due to side effects. Motivated to resume therapy with oral semaglutide  and lifestyle modifications. Reviewed risks of GLP-1 agonists. Anticipated outcome is further weight reduction and improved metabolic health. - Initiated oral semaglutide  at the lowest dose via psychologist, counselling. - Instructed her to contact the pharmacy regarding dose escalation and maintenance dosing. - Scheduled three-month follow-up to monitor progress and address side effects. - Advised to message for prescription refills as needed. - Recommended regular monitoring of bowel habits and use of fiber, Miralax, or probiotics as needed. - Encouraged ongoing lifestyle modifications, including increased physical activity and dietary improvements. - Ordered baseline laboratory studies at [facility] prior to therapy initiation.         Assessment & Plan Healthcare maintenance  Orders:   CBC   Comprehensive metabolic panel with GFR   Hemoglobin A1c   Lipid panel   TSH  Primary hypertension     Migraine without aura and with status migrainosus, not intractable     Obesity due to excess calories with serious comorbidity, unspecified class        No follow-ups on file.  Selinda JINNY Ku, MD, CAQSM   Primary Care Sports [facility]      [1] No Known Allergies [2]  Current Outpatient Medications on File Prior to Visit  Medication Sig Dispense Refill   ondansetron  (ZOFRAN -ODT) 4 MG disintegrating tablet Take 1 tablet (4 mg total) by mouth every 6 (six) hours as needed for nausea or vomiting. 20 tablet 0   SUMAtriptan  (IMITREX ) 50 MG tablet Take 1 tablet (50 mg total) by mouth every 2 (two) hours as needed for migraine. May repeat in 2 hours if headache persists or recurs. 30 tablet 2   No current facility-administered medications on file  prior to visit.   "

## 2024-06-10 NOTE — Patient Instructions (Addendum)
-   Obtain fasting labs with orders provided (can have water  or black coffee but otherwise no food or drink x 8 hours before labs) - Review information provided - Attend eye doctor annually, dentist every 6 months, work towards or maintain 30 minutes of moderate intensity physical activity at least 5 days per week, and consume a balanced diet - Return in 1 year for physical - Contact us  for any questions between now and then   VISIT SUMMARY:  Today, we discussed your progressive left foot pain and bony prominence, as well as your weight management goals. We have initiated a treatment plan for both issues to help manage your symptoms and support your health goals.  YOUR PLAN:  BONE PAIN OF BOTH FEET: You have joint pain in your left foot, causing a bony prominence, especially with prolonged shoe wear and by the end of the day. -Use topical diclofenac gel up to four times daily as needed for pain relief. -If the topical treatment is not enough, take oral meloxicam  as prescribed. -Consider shoe modifications and professional shoe fitting services to reduce discomfort (Fleet Feet). -If symptoms persist, we may explore the use of orthotic inserts. -Monitor your symptoms and report any worsening pain.  OBESITY AND WEIGHT MANAGEMENT: You have a history of obesity and previously lost 70 pounds with semaglutide . You are interested in resuming weight management with oral semaglutide  and are actively making lifestyle changes. -Start taking oral semaglutide  at the lowest dose as prescribed. -Contact the pharmacy for dose escalation and maintenance dosing instructions. -Schedule a follow-up appointment in three months to monitor your progress and address any side effects. -Message us  for prescription refills as needed. -Monitor your bowel habits and use fiber, Miralax, or probiotics as needed. -Continue with your lifestyle modifications, including increased physical activity and dietary  improvements. -Complete baseline laboratory studies at [facility] before starting the therapy.

## 2024-06-12 ENCOUNTER — Other Ambulatory Visit: Payer: Self-pay

## 2024-06-12 NOTE — Progress Notes (Signed)
 Disregard.  JM

## 2024-09-08 ENCOUNTER — Ambulatory Visit: Payer: PRIVATE HEALTH INSURANCE | Admitting: Family Medicine
